# Patient Record
Sex: Female | Born: 1995 | Hispanic: No | Marital: Single | Smoking: Never smoker
Health system: Southern US, Community
[De-identification: ages and names within clinical notes are randomized; demographics above are authoritative.]

## PROBLEM LIST (undated history)

## (undated) DIAGNOSIS — N83209 Unspecified ovarian cyst, unspecified side: Secondary | ICD-10-CM

## (undated) DIAGNOSIS — Z789 Other specified health status: Secondary | ICD-10-CM

## (undated) DIAGNOSIS — H539 Unspecified visual disturbance: Secondary | ICD-10-CM

## (undated) DIAGNOSIS — R51 Headache: Secondary | ICD-10-CM

## (undated) HISTORY — DX: Other specified health status: Z78.9

## (undated) HISTORY — DX: Unspecified visual disturbance: H53.9

## (undated) HISTORY — DX: Unspecified ovarian cyst, unspecified side: N83.209

## (undated) HISTORY — PX: WISDOM TOOTH EXTRACTION: SHX21

## (undated) HISTORY — DX: Headache: R51

---
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
## 2014-01-18 ENCOUNTER — Telehealth: Payer: Self-pay | Admitting: Pediatric Neurology
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
## 2014-01-27 ENCOUNTER — Encounter: Payer: Self-pay | Admitting: Pediatric Neurology

## 2014-01-27 ENCOUNTER — Ambulatory Visit: Payer: Self-pay | Admitting: Pediatric Neurology

## 2014-01-27 VITALS — BP 105/74 | HR 91 | Ht 62.05 in | Wt 124.3 lb

## 2014-01-27 DIAGNOSIS — F32A Depression, unspecified: Secondary | ICD-10-CM

## 2014-01-27 DIAGNOSIS — G43009 Migraine without aura, not intractable, without status migrainosus: Secondary | ICD-10-CM

## 2014-01-27 DIAGNOSIS — F419 Anxiety disorder, unspecified: Secondary | ICD-10-CM

## 2014-01-27 MED ORDER — TOPIRAMATE 25 MG PO TABS *I*
ORAL_TABLET | ORAL | Status: DC
Start: 2014-01-27 — End: 2014-04-14

## 2014-01-27 MED ORDER — AMITRIPTYLINE HCL 25 MG PO TABS *I*
25.0000 mg | ORAL_TABLET | Freq: Every evening | ORAL | Status: DC
Start: 2014-01-27 — End: 2014-05-25

## 2014-01-27 NOTE — Patient Instructions (Signed)
1.  Wean from Topamax.  Take 3 tablets (75 mg) nightly x 1 week then,  Take 2 tablets (50 mg) nightly x 1 week, then  Take 1 tablet (25 mg) nightly x 1 week  Then discontinue.    2.  Increase Amitriptyline to 25 mg nightly.  Call in 3 weeks with update and possible dosage adjustment.    3.  Follow up in 6 weeks.

## 2014-01-27 NOTE — Progress Notes (Signed)
Dear Dr. Carrington Clamp,     Interim History:  Martha Soto was last seen in Child Neurology on 06/04/2013.  She was unaccompanied for today's visit. . At the last clinic visit the plan was to :    Headache Prevention:   Continue current dose of Topamax 100 mg nightly   Add Amitriptyline 10 mg nightly   Abortive Management:   -Give medication for pain at first sign on headache onset: Imitrex   -Daily preventative medication should be considered or adjusted if more than 2 doses of pain medication are needed weekly.   Lifestyle Recommendations and/or Modifications:   -Avoid skipping meals, especially breakfast   -Increase non-caffeinated fluid intake   -Increase regular exercise   -Avoid daytime napping to keep regular sleep schedule   Follow up:   -Follow up appt in :6 months     Since that time, Martha Soto reports the following:    -Frequency of attacks: Daily; continuous, but does have some breaks from the pain.  Intensity level is 8-10/10.    -Location & type of pain:  Frontal and behind eyes and radiates to the back of her head; describes pain as pressure.  -Associated symptoms:  Dizziness, nausea, photo and phonophobia, hands and feet feel weak.   -Duration of attacks:  continuous  -Triggers identified:  Anger (home situation, particularly with mother).  Appetite: Eats a small breakfast like cereal or bagel and then nothing more the rest of the day or gets a small snack later in the evening.  Hydration:  Drinks Brisk during the day -2-3 soda bottles/day.  She also drinks water or orange juice and milk.   Sleep pattern:  Awakens in the middle of the night-3-4 am.  She may fall back to sleep immediately or be up for an hour at a time.  Sleep onset-1-2 hours.   -Recent stressors:   home  -currently taking Topamax 100 mg nightly and Amitriptyline 10 mg nightly.    Medical History: As noted in interim history.                                                                                                                            Family history: No interim changes.     Personal History:    Holiday representative at [employer];  Accepted at [employer], but would like to attend a community college outside of [place]2 + 2 program[place]  Plans on [employer] or Sociology with ultimate plan for law school.  Works as a Designer, fashion/clothing. She is seeing Nature conservation officer for therapy for depression.    Allergy:  Review of patient's allergies indicates no known allergies (drug, envir, food or latex).    Current Medications: Medications reviewed and list updated.  Current Outpatient Prescriptions   Medication Sig Dispense Refill    naproxen (NAPROSYN) 500 MG tablet         amitriptyline (ELAVIL) 25 MG tablet Take 1 tablet (25 mg total) by mouth  nightly  30 tablet  5    topiramate (TOPAMAX) 25 MG tablet Wean from Topamax according to schedule provided to patient.  45 tablet  0    Omeprazole (PRILOSEC) 20 MG TBEC         HYDROcodone-acetaminophen (NORCO) 5-325 MG per tablet Take 1 tablet by mouth every 4-6 hours as needed for Pain   MDD 5 tablets  5 tablet  0    benzoyl peroxide-erythromycin (BENZAMYCIN) gel         fluticasone (FLONASE) 50 MCG/ACT nasal spray         ibuprofen (ADVIL,MOTRIN) 600 MG tablet         loratadine (CLARITIN) 10 MG tablet         TAZORAC 0.1 % cream         SUMAtriptan (IMITREX) 50 MG tablet Take 1 tablet (50 mg total) by mouth as needed for Migraine   . May repeat once in 2 hours. MDD: 2 tabs Max weekly dose 4 tabs  9 tablet  3    norethindrone-ethinyl estradiol (BALZIVA) 0.4-35 MG-MCG per tablet Take 1 tablet by mouth daily         No current facility-administered medications for this visit.        ROS :    A 10 point review of systems was completed and was negative except for what was mentioned in the Interim History.  Exam:  BP 105/74   Pulse 91   Ht 1.576 m (5' 2.05")   Wt 56.4 kg (124 lb 5.4 oz)   BMI 22.71 kg/m2  On neurological examination, Shewas alert, attentive, fluent, and cogent. There was normal  pupillary function and gaze was conjugate, without nystagmus.  facial movements were symmetric with normal strength. Hearing appeared grossly intact. Her  tongue was midline. She had normal muscle tone and with no pronator drift and grossly normal strength.  She displayed no dysmetria on finger-to-nose. Her deep tendon reflexes were normal and symmetrical. Her coordination was normal.  Her gait was normal and narrow based.  She was able to toe and heel walk, hop on one and two feet.  Romberg revealed no sway.         Assessment:   Martha Soto has a history of migraine without aura and depression.    Recommendations:   1.  Wean from Topamax.  Take 3 tablets (75 mg) nightly x 1 week then,  Take 2 tablets (50 mg) nightly x 1 week, then  Take 1 tablet (25 mg) nightly x 1 week  Then discontinue.    2.  Increase Amitriptyline to 25 mg nightly.  Call in 3 weeks with update and possible dosage adjustment.    3.  Follow up in 6 weeks.    Martha Soto's family was instructed to contact either the primary care physician office or our office by telephone if there is any deterioration in her neurologic status, change in presenting symptoms, lack of beneficial response to treatment plan, or signs of adverse effects of current therapies, all of which were reviewed.       Electronically signed by Arvil PersonsYNTHIA Calaya Gildner, NP 3:57 PM 01/27/2014.  Division of Child Neurology  [facility]  [address], [address]  [place], [place]  [phone]  [phone]  [phone]-fax

## 2014-04-14 ENCOUNTER — Encounter: Payer: Self-pay | Admitting: Pediatric Neurology

## 2014-04-14 ENCOUNTER — Ambulatory Visit: Payer: Self-pay | Admitting: Pediatric Neurology

## 2014-04-14 VITALS — BP 110/62 | HR 88 | Ht 62.17 in | Wt 128.3 lb

## 2014-04-14 DIAGNOSIS — R519 Headache, unspecified: Secondary | ICD-10-CM

## 2014-04-14 DIAGNOSIS — G43009 Migraine without aura, not intractable, without status migrainosus: Secondary | ICD-10-CM

## 2014-04-14 MED ORDER — TOPIRAMATE 25 MG PO TABS *I*
ORAL_TABLET | ORAL | Status: AC
Start: 2014-04-14 — End: ?

## 2014-04-14 NOTE — Progress Notes (Signed)
Dear Dr. Carrington ClampMax Steiner,     Interim History:  Martha Soto was last seen in Child Neurology on 01/28/2014.  Today she was unccompanied for her visit. At the last clinic visit the plan was to :    Headache Prevention:  Continue current dose of  Topamax 100 mg nightly  Add Amitriptyline 10 mg nightly  Abortive Management:  -Give medication for pain at first sign on headache onset: Imitrex  -Daily preventative medication should be considered or adjusted if more than 2 doses of pain medication are needed weekly.   Lifestyle Recommendations and/or Modifications:   -Avoid skipping meals, especially breakfast  -Increase non-caffeinated fluid intake  -Increase regular exercise  -Avoid daytime napping to keep regular sleep schedule  Follow up:  -Follow up appt in :6 months    Since that time, Martha Soto reports the following: Her depression is under better control with the Amitriptyline, however, she thinks her headaches were much less intense with Topamax.    -Frequency of attacks: Daily, not continuous but lasts several hours.  -Location & type of pain:  Top of head or right parietal.  She feels as though the pain is also radiating throughout her whole head. Today, she rates her pain score at a 9, however, she was very interactive throughout the visit and happy.  -Associated symptoms:   Dizziness, vision changes (very brief), nausea   -Duration of attacks:  One hour to several hours or all day until she goes to sleep.  -Triggers identified:  Unable to determine.  Appetite: Poor appetite due to pain, but she is eating regular meals.  Hydration:  is drinking plenty of fluids.   Sleep pattern:  Goes to bed at 10-11pm.  It takes 1-1/2 hours to fall asleep.  She awakens during the night and remains awake for 30 minutes then returns to sleep.  This occurs 2-3 times during the night. She awakens by 6:30 am.     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 Janyth ContesGoldblatt once/weekly for therapy.  She finds this to be helpful but would like to discuss coping mechanisms and strategies with her.  She feels that her depression is much better managed, she is in a better mood.    Medical History: As noted in interim history.                                                                                                                           Family history: No interim changes.     Personal History:    Senior year of high school.    Allergy:  Review of patient's allergies indicates no known allergies (drug, envir, food or latex).    Current Medications: Medications reviewed and list updated.  Current Outpatient Prescriptions on File Prior to Visit   Medication Sig Dispense Refill    naproxen (NAPROSYN) 500 MG tablet  amitriptyline (ELAVIL) 25 MG tablet Take 1 tablet (25 mg total) by mouth nightly  30 tablet  5    topiramate (TOPAMAX) 25 MG tablet Wean from Topamax according to schedule provided to patient.  45 tablet  0    Omeprazole (PRILOSEC) 20 MG TBEC         HYDROcodone-acetaminophen (NORCO) 5-325 MG per tablet Take 1 tablet by mouth every 4-6 hours as needed for Pain   MDD 5 tablets  5 tablet  0    benzoyl peroxide-erythromycin (BENZAMYCIN) gel         fluticasone (FLONASE) 50 MCG/ACT nasal spray         ibuprofen (ADVIL,MOTRIN) 600 MG tablet         loratadine (CLARITIN) 10 MG tablet         TAZORAC 0.1 % cream         SUMAtriptan (IMITREX) 50 MG tablet Take 1 tablet (50 mg total) by mouth as needed for Migraine   . May repeat once in 2 hours. MDD: 2 tabs Max weekly dose 4 tabs  9 tablet  3    norethindrone-ethinyl estradiol (BALZIVA) 0.4-35 MG-MCG per tablet Take 1 tablet by mouth daily         No current facility-administered medications on file prior to visit.        ROS :    A 10 point review of systems was completed and was negative except for what was mentioned in the Interim History.  Exam:  BP 110/62    Pulse 88    Ht 1.579 m (5'  2.17")    Wt 58.2 kg (128 lb 4.9 oz)    BMI 23.34 kg/m2     On neurological examination, Shewas alert, attentive, fluent, and cogent. There was normal pupillary function and gaze was conjugate, without nystagmus.  facial movements were symmetric with normal strength. Hearing appeared grossly intact. Her  tongue was midline. She had normal muscle tone and with no pronator drift and grossly normal strength.  She displayed no dysmetria on finger-to-nose. Her deep tendon reflexes were normal and symmetrical. Her coordination was normal.  Her gait was normal and narrow based.  She was able to toe and heel walk, hop on one and two feet.  Romberg revealed no sway.         Assessment:   Martha Soto has a history of Chronic Daily Headache and Migraine without Aura. She has a poor sleep quality which may be contributing to her symptoms.    Recommendations:   1.  Continue Amitriptyline 25 mg nightly.  2.  Restart Topamax at 25 mg nightly for 1 week, then increase to 50 mg x 1 week, then 75 mg x 1 week, then 100 mg nightly.  3.  If considering a change in birth control, I would recommend a low estrogen pill like Lo-Loestrin with Fe.  4.  Continue good fluid intake each day.  5.  Follow up with Adult Neurology-Marina Lula Olszewskionnolly, MD        Martha Soto's family was instructed to contact either the primary care physician office or our office by telephone if there is any deterioration in her neurologic status, change in presenting symptoms, lack of beneficial response to treatment plan, or signs of adverse effects of current therapies, all of which were reviewed.       Electronically signed by Arvil PersonsYNTHIA Halei Hanover, NP 4:17 PM 04/14/2014.  Division of Child Neurology  [facility]  [address], [address]  [address] [place]  [address]  [phone]  [phone]-fax

## 2014-04-14 NOTE — Patient Instructions (Addendum)
Recommendations:   1.  Continue Amitriptyline 25 mg nightly.  2.  Restart Topamax at 25 mg nightly for 1 week, then increase to 50 mg x 1 week, then 75 mg x 1 week, then 100 mg nightly.  3.  If considering a change in birth control, I would recommend a low estrogen pill like Lo-Loestrin with Fe.  4.  Continue good fluid intake each day.  5.  Follow up with Adult Neurology

## 2014-05-25 ENCOUNTER — Ambulatory Visit: Payer: Self-pay | Admitting: Neurology

## 2014-05-25 ENCOUNTER — Encounter: Payer: Self-pay | Admitting: Neurology

## 2014-05-25 ENCOUNTER — Telehealth: Payer: Self-pay | Admitting: Neurology

## 2014-05-25 VITALS — BP 117/67 | HR 85 | Ht 62.0 in | Wt 121.7 lb

## 2014-05-25 DIAGNOSIS — IMO0002 Reserved for concepts with insufficient information to code with codable children: Secondary | ICD-10-CM

## 2014-05-25 MED ORDER — AMITRIPTYLINE HCL 25 MG PO TABS *I*
50.0000 mg | ORAL_TABLET | Freq: Every evening | ORAL | Status: AC
Start: 2014-05-25 — End: 2014-06-24

## 2014-05-25 MED ORDER — AMITRIPTYLINE HCL 25 MG PO TABS *I*
50.0000 mg | ORAL_TABLET | Freq: Every evening | ORAL | Status: DC
Start: 2014-05-25 — End: 2014-05-25

## 2014-05-25 NOTE — Progress Notes (Signed)
 NEUROLOGY Clinic Consult Note    Referring Provider: Dr. Madaline Guthrie    Reason for consult: headaches    History of Present Illness:      Thank you for referring Martha Soto for evaluation at my [facility] on 05/25/2014    She is a 18 y.o. R -handed female referred for chronic headaches for the past 3-4 years.  Martha Soto c/o recurrent throbbing/sharp headaches that start on the left side of her head, "deep behind left eye" but rapidly radiating to involve her entire head, back of neck, and both shoulders.  Headaches are usually associated with nausea, photophobia, phonophobia, and bilateral blurry vision, and also with "black spots" that flash in both eyes (when closed) for about 10-20 minutes early in headache.  Her headaches are usually preceded by a cold sensation of left side of head.  During headache her scalp is tender in temporal regions, as well as left occipital notch area.  There are no autonomic sx.  She sometimes feels lightheaded but there is no vertigo.  She has no focal weakness or sensory sx or speech disturbance; she does feel off balanced during headache.  No diplopia, face numbness.  She does feel weak "all over" during headache.  Headaches frequently last entire day up to 3 days), and she does have trouble concnetrating and maintaining normal activities when she has headache.  She does get some headache relief if she lays down, rests in dark room.  Over-the-counter meds tylenol, ibuprofen, naproxen) provide minimal relief.  She has some benefit from imitrex, lasting a few hours.  She has missed school multiple times due to headaches.  She has no specific neurologic sx between headaches.      Over the past year or so the headaches have become more frequent, now occurring near-daily.  When first symptomatic h/a frequency was 1-2 times weekly.  Over past year headaches have been present at least 5 days per week.  They tend to begin in the late morning, when at school, then worsen as day  progresses.  There is no exacerbation with coughing, sneezing, although poor sleep does worsen headache.  At times she has been awakened by headaches, although for the most part sleep seems to provide headache relief.  When severe there is associated nausea, vomiting, photophobia, and phonophobia, although she has low level photophobia chronically.      Regarding sleep: she gets on average 5-6 hours per night, frequently laying in bed prior to sleep either watching television or texting her friends.  There is no h/o snoring, sleep-disordered breathing, or RLS.  She rarely awakes refreshed.  She does feel sleepy during the day often., especialy in school (now a senior at [employer], heading to [employer] in the fall).  She works 20 hours per week.  She drinks one caffeinated beverage per day.  She is on a low-estrogen oral contraceptive.  She has h/o depression and self-mutilation behavior (cutting herself).  She was treated with an anti-depressant in past, now in therapy weekly.    She is currently on topamax 75 mg qhs and amitriptyline 25 mg qhs for h/a prophylaxis.  She had been on topamax previously (1-2 years ag), with some benefit on h/a seveity.  However, she did c/o sedation and suppressed appetite on topamax, perhaps worsening depression.  Topamax was stopped after several months, recently restarted due to headache worsening.  She says headache frequency have not significantly changed since topamax was restarted, although they may be less severe in  intensity.  She is not using OTC analgesics or any analgesic or triptan at this time.            PMH  Past Medical History   Diagnosis Date   . Allergy history unknown      seasonal   . Headache(784.0)    . Vision abnormalities      wears contacts   . Ovarian cyst        PSH  No past surgical history on file.      Medications: reviewed        ALLERGIES:    No Known Allergies (drug, envir, food or latex) seasonal        FH  Family History   Problem Relation Age of Onset    . Hypertension Mother    . Migraines Mother    . Diabetes Maternal Grandfather    . Learning disabilities Maternal Uncle          SOCIAL HISTORY  History     Social History   . Marital Status: Single     Spouse Name: N/A     Number of Children: N/A   . Years of Education: N/A     Social History Main Topics   . Smoking status: Never Smoker    . Smokeless tobacco: Never Used   . Alcohol Use: No   . Drug Use: No   . Sexual Activity: None     Other Topics Concern   . None     Social History Narrative    Marijuana-last 5 months ago    Alcohol-last 1 year ago    Denies cigarettes.      Lives at home with mom, moms boyfriends, 3 brothers, 1 uncle, 2 birds and a dog. Sees dad occasionally. In 12th grade at [employer].  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  On BCPs.  HEME/LYMPH: No easy bleeding/bruising or swollen nodes          Physical Exam  BP 117/67   Pulse 85   Ht 1.575 m (5\' 2" )   Wt 55.203 kg (121 lb 11.2 oz)   BMI 22.25 kg/m2       Examination:  General: WD/WN female, NAD.  HEENT:  Normocephalic/atraumatic.  Neck:  Supple; no bruits.  Cardiac: RRR.  S1/S2.  Pulmonary: clear  Pulses: normal radials.    Neurologic Examination:      Mental status: awake, alert and fully oriented.  Speech and language are normal without dysarthria or aphasia.  Follows simple, left-right, multistepped commands.  No apraxia or neglect.  Attention, concentration and memory are grossly  intact.  Higher cognitive functions are generally normal.      Cranial nerves: Pupils equally round and reactive to light.   There is no afferent pupillary defect.  Visual fields are full.  Visual acuity  J1(-1)/J1.  Fundi  sharp without papilledema or hemorrhage.  Extraocular movements full without nystagmus.  Facial sensation  intact bilaterally to PP/LT  in all three trigeminal divisions.  There is no facial weakness.  Hearing is intact to finger rub bilaterally.  Palate/uvula with midline elevation.  Gag intact.  Shoulder shrug  5/5. Tongue midline without atrophy, weakness, or fasciculations.      Motor:  power 5/5 throughout.    No pronator drift.  Muscle bulk and tone normal.  No fasciculations.  No dyskinesias    Sensory:  intact to pain, temperature, light touch, vibration, and proprioception.  Romberg negative. There is no extinction to double simultaneous stimulation.  Graphesthesia normal.      Gait steady with normal associated arm movements.  Toe/heel/hop normal.  Tandem performed well.      Coordination testing showed normal finger-nose-finger, heel-to-shin and rapid alternating movements bilaterally.      Reflexes: Biceps 2+/2+, BR 2+/2+, Tri 2+/2+, KJ 2+/2+, AJ 2+/2+.  Toes both downgoing.  No ankle clonus.  No frontal release signs.          Labs:            Imaging:            IMPRESSION    Chronic migraine, with near-daily headaches, likely in part driven by sleep deprivation.  She has reassuringly normal neurologic exam.  She has an usual h/a prophylaxis regimen of topamax and amitriptyline both at suboptimal doses.   She drinks moderate caffeine but does not use analgesics or triptans.  Does not appear overtly depressed but she does have significant stressors from work and school.      RECOMMENDATIONS:    1.  I would prefer monotherapy with amitriptyline (for h/a prophylaxis, perhaps sleep, and depression).  Will increase to 5 mg qhs.  2.  Will taper off of topamax: 50 mg qhs x 1 week, 25 mg qhs  x 1 week, then stop  3.  Headache diary.  No analgesics or triptans for now.  4.   She needs better sleep hygiene: to discuss w/Dr. Madaline Guthrie.      rtc 3 months but consider headache clinic referral if no i

## 2014-05-25 NOTE — Telephone Encounter (Signed)
I can rewrite for 60 tablets.  Can you call pharmacy?  Thanks.

## 2014-05-25 NOTE — Patient Instructions (Signed)
1.  Wean off topamax (25 mg).  Take 2 topamax at bedtime for 1 week, then 1 topamax at bedtime for 1 week, then stop.  2.  Increase amitriptyline to 50 mg (two 25 mg tabs) at bedtime  3.  You need to address sleep hygiene with your pediatrician  4.  I will refer you to headache specialist  5.  No over-the-counter medications tylenol, advil, etc)

## 2014-05-25 NOTE — Telephone Encounter (Signed)
Pharmacy needs clarification on amitrtiptyline script #30 prescribed, directions state 2 at bedtime

## 2014-08-17 ENCOUNTER — Ambulatory Visit: Payer: Self-pay | Admitting: Neurology

## 2015-02-07 ENCOUNTER — Other Ambulatory Visit: Payer: Self-pay | Admitting: Emergency Medicine

## 2015-02-07 LAB — COMPREHENSIVE METABOLIC PANEL
ALT: 19 U/L^U/L (ref 12–78)
AST: 13 U/L^U/L (ref 11–37)
Albumin: 3.6 g/dL^g/dL (ref 3.4–5.0)
Alk Phos: 52 U/L^U/L (ref 45–117)
Anion Gap: 8 (ref 3–11)
Bilirubin,Total: 0.3 mg/dL^mg/dL (ref 0.2–1.0)
CO2: 26 meq/L^meq/L (ref 21–32)
Calcium: 8.7 mg/dL^mg/dL (ref 8.5–10.1)
Chloride: 103 meq/L^meq/L (ref 98–107)
Creatinine: 0.7 mg/dL^mg/dL (ref 0.6–1.3)
GFR,Black: 130 mL/min/{1.73_m2}
GFR,Caucasian: 108 mL/min/{1.73_m2}
Glucose: 75 mg/dL^mg/dL (ref 70–100)
Lab: 12 mg/dL^mg/dL (ref 7–18)
Potassium: 3.7 meq/L^meq/L (ref 3.5–5.1)
Sodium: 137 meq/L^meq/L (ref 136–145)
Total Protein: 7.5 g/dL^g/dL (ref 6.4–8.2)

## 2015-02-07 LAB — URINALYSIS WITH REFLEX TO MICROSCOPIC
Bilirubin,Ur: NEGATIVE
Blood,UA: NEGATIVE
Glucose,UA: NEGATIVE mg/dL^mg/dL
Ketones, UA: NEGATIVE mg/dL^mg/dL
Leuk Esterase,UA: NEGATIVE
Nitrite,UA: NEGATIVE
RBC,UA: 0 /HPF^/HPF (ref 0–5)
Renal Epithel,UA: 9 /HPF^/HPF
Specific Gravity,UA: 1.026 (ref 1.005–1.030)
Urobilinogen,UA: 0.2 mg/dL^mg/dL
WBC,UA: 5 /HPF^/HPF (ref 0–5)
pH,UA: 7 (ref 5.0–8.0)

## 2015-02-07 LAB — CBC AND DIFFERENTIAL
Baso # K/uL: 0 10*3/uL (ref 0.0–0.1)
Basophil %: 0.4 %^% (ref 0.1–1.2)
Eos # K/uL: 0.1 10*3/uL (ref 0.0–0.4)
Eosinophil %: 1.2 %^% (ref 0.7–5.8)
Hematocrit: 42.2 %^% (ref 34.1–44.9)
Hemoglobin: 14.5 g/dL^g/dL (ref 11.2–15.7)
Immature Granulocytes Absolute: 0.03 10*3/uL (ref 0.0–0.2)
Immature Granulocytes: 0.3 %^% (ref 0.0–2.0)
Lymph # K/uL: 5.1 10*3/uL — ABNORMAL HIGH (ref 1.2–3.7)
Lymphocyte %: 53.9 %^% — ABNORMAL HIGH (ref 19.3–51.7)
MCH: 28.7 pg^pg (ref 25.6–32.2)
MCHC: 34.4 g/dL^g/dL (ref 32.2–35.5)
MCV: 83.6 fL^fL (ref 79.4–94.8)
Mono # K/uL: 0.7 10*3/uL (ref 0.2–0.9)
Monocyte %: 7.3 %^% (ref 4.7–12.5)
Neut # K/uL: 3.5 10*3/uL (ref 1.6–6.0)
Nucl RBC %: 0 /100 WBC^/100 WBC (ref 0.0–0.2)
Platelets: 272 10*3/uL (ref 182–369)
RBC Distribution Width-SD: 35.4 fL^fL — ABNORMAL LOW (ref 36.4–46.3)
RBC: 5.05 10*6/uL (ref 3.93–5.22)
RDW: 11.9 %^% (ref 11.7–14.4)
Seg Neut %: 36.9 %^% (ref 34.0–71.1)
WBC: 9.5 10*3/uL (ref 4.0–10.0)

## 2015-02-07 LAB — PREGNANCY TEST, SERUM: Preg,Serum: NEGATIVE

## 2015-02-07 LAB — LIPASE: Lipase: 150 U/L^U/L (ref 73–393)

## 2015-08-21 ENCOUNTER — Emergency Department
Admission: EM | Admit: 2015-08-21 | Disposition: A | Discharge status: Home / Self Care | Payer: Self-pay | Source: Ambulatory Visit | Attending: Emergency Medicine | Admitting: Emergency Medicine

## 2015-08-21 NOTE — First Provider Contact (Signed)
ED Medical Screening Exam Note    Initial provider evaluation performed by   ED First Provider Contact     Date/Time Event User Comments    08/21/15 2321 ED Provider First Contact Nastashia Gallo AYERS Initial Face to Face Provider Contact      Low abd pain since this am.  She missed BC and restarted.     Vital signs reviewed.    Orders placed:  LABS     Supervising physician Dr. Ruben Reason was immediately available.      Patient requires further evaluation.     Joellyn Haff, NP, 08/21/2015, 11:21 PM         Joellyn Haff, NP  08/21/15 1610

## 2015-08-21 NOTE — ED Triage Notes (Signed)
Pt to ed with c/o:  1. Abdominal pain  2. Numbness  3. Chest Pain  4. Nausea  5. Emesis  6. Headache  7. Dizziness    Sx began yesterday, pain increased this am        Triage Note   Lazarus Gowda, RN

## 2015-08-22 ENCOUNTER — Encounter: Payer: Self-pay | Admitting: Emergency Medicine

## 2015-08-22 ENCOUNTER — Other Ambulatory Visit: Payer: Self-pay | Admitting: Gastroenterology

## 2015-08-22 ENCOUNTER — Other Ambulatory Visit: Payer: Self-pay | Admitting: Emergency Medicine

## 2015-08-22 LAB — CBC AND DIFFERENTIAL
Baso # K/uL: 0 10*3/uL (ref 0.0–0.1)
Basophil %: 0.3 %
Eos # K/uL: 0.1 10*3/uL (ref 0.0–0.4)
Eosinophil %: 1.4 %
Hematocrit: 43 % (ref 34–45)
Hemoglobin: 14 g/dL (ref 11.2–15.7)
IMM Granulocytes #: 0 10*3/uL (ref 0.0–0.1)
IMM Granulocytes: 0.1 %
Lymph # K/uL: 4.3 10*3/uL — ABNORMAL HIGH (ref 1.2–3.7)
Lymphocyte %: 46.6 %
MCH: 28 pg/cell (ref 26–32)
MCHC: 33 g/dL (ref 32–36)
MCV: 87 fL (ref 79–95)
Mono # K/uL: 0.6 10*3/uL (ref 0.2–0.9)
Monocyte %: 6.3 %
Neut # K/uL: 4.2 10*3/uL (ref 1.6–6.1)
Nucl RBC # K/uL: 0 10*3/uL (ref 0.0–0.0)
Nucl RBC %: 0 /100 WBC (ref 0.0–0.2)
Platelets: 260 10*3/uL (ref 160–370)
RBC: 4.9 MIL/uL (ref 3.9–5.2)
RDW: 12.7 % (ref 11.7–14.4)
Seg Neut %: 45.3 %
WBC: 9.2 10*3/uL (ref 4.0–10.0)

## 2015-08-22 LAB — RUQ PANEL (ED ONLY)
ALT: 15 U/L (ref 0–35)
AST: 20 U/L (ref 0–35)
Albumin: 4.1 g/dL (ref 3.5–5.2)
Alk Phos: 36 U/L — ABNORMAL LOW (ref 50–130)
Amylase: 75 U/L (ref 28–100)
Bilirubin,Direct: <0.2 mg/dL (ref 0.0–0.3)
Bilirubin,Total: 0.4 mg/dL (ref 0.0–1.2)
Lipase: 28 U/L (ref 13–60)
Total Protein: 6.6 g/dL (ref 6.3–7.7)

## 2015-08-22 LAB — HOLD GREEN WITH GEL

## 2015-08-22 LAB — BHCG, QUANT PREGNANCY: BHCG, QUANT PREGNANCY: <1 m[IU]/mL (ref 0–1)

## 2015-08-22 MED ORDER — ONDANSETRON 4 MG PO TBDP *I*
4.0000 mg | ORAL_TABLET | Freq: Three times a day (TID) | ORAL | 0 refills | Status: DC | PRN
Start: 2015-08-22 — End: 2018-01-08

## 2015-08-22 MED ORDER — ONDANSETRON HCL 2 MG/ML IV SOLN *I*
4.0000 mg | Freq: Once | INTRAMUSCULAR | Status: AC
Start: 2015-08-22 — End: 2015-08-22
  Administered 2015-08-22: 4 mg via INTRAVENOUS
  Filled 2015-08-22: qty 2

## 2015-08-22 MED ORDER — IOHEXOL 350 MG/ML (OMNIPAQUE) IV SOLN *I*
1.0000 mL | Freq: Once | INTRAVENOUS | Status: AC
Start: 2015-08-22 — End: 2015-08-22
  Administered 2015-08-22: 85 mL via INTRAVENOUS

## 2015-08-22 MED ORDER — IBUPROFEN 400 MG PO TABS *I*
600.0000 mg | ORAL_TABLET | Freq: Once | ORAL | Status: AC
Start: 2015-08-22 — End: 2015-08-22
  Administered 2015-08-22: 600 mg via ORAL
  Filled 2015-08-22 (×2): qty 1

## 2015-08-22 MED ORDER — ONDANSETRON 4 MG PO TBDP *I*
4.0000 mg | ORAL_TABLET | Freq: Once | ORAL | Status: AC
Start: 2015-08-22 — End: 2015-08-22
  Administered 2015-08-22: 4 mg via ORAL
  Filled 2015-08-22: qty 1

## 2015-08-22 MED ORDER — OXYCODONE-ACETAMINOPHEN 5-325 MG PO TABS *I*
2.0000 | ORAL_TABLET | Freq: Once | ORAL | Status: AC
Start: 2015-08-22 — End: 2015-08-22
  Administered 2015-08-22: 2 via ORAL
  Filled 2015-08-22: qty 2

## 2015-08-22 MED ORDER — SODIUM CHLORIDE 0.9 % IV BOLUS *I*
1000.0000 mL | Freq: Once | Status: AC
Start: 2015-08-22 — End: 2015-08-22
  Administered 2015-08-22: 1000 mL via INTRAVENOUS

## 2015-08-22 NOTE — ED Notes (Signed)
Medical student at bedside

## 2015-08-22 NOTE — ED Notes (Signed)
Pt tolerating PO intake appropriately.  

## 2015-08-22 NOTE — ED Notes (Signed)
Pt up and ambulatory without assistance. Pt tolerating PO intake. Pt D/C instructions reviewed. Pt comfortable with D/C planning and verbalizes understanding of D/C instruction. Pt will follow up with PCP. Pt belongings with pt. Pt safe to D/C a this time.

## 2015-08-22 NOTE — Progress Notes (Signed)
Patient DOB:  [birth year]    Patient Address:  [address]  [place] [address]    Patient Phone:  [phone] (home)     Patient Insurance:  [place],    [place],    [id number]  [place],    MCD,    [id number]          Patient PCP:  Sonda Primes, MD    Patient Appointment Calendar  @    Intervention Status:  DSRIP Intervention status: Initial intervention  DSRIP Patient?: Yes  Patient address confirmation: Yes  Patient phone confirmation: Yes  Patient insurance confirmation: Yes  Patient FCM referred?: No  Patient PCP confirmation: Yes  PCP Appt type: Follow up appt  Follow up appointment center/address::  [facility] [address] [place] [address])  Patient followup provider: Carrington Clamp   [facility] Patient followup appointment date: 08/28/15  Patient followup appointment time: 1615  Patient transport plan: self  CHW handoff to involved agency?: No  FCM Patient application complete?: No  PAM completed?: Refused  Time Spent with Patient (min): 25    [employer]  CHW  [phone]

## 2015-08-22 NOTE — ED Provider Notes (Addendum)
History     Chief Complaint   Patient presents with    Abdominal Pain    Dizziness    Headache    Nausea    Emesis    Chest Pain    Numbness       HPI Comments: 18yo with hx of ovarian cysts p/w n/v, abd pain starting yesterday.     She also notes chest pain which began at the same time, however she state get this pain from time to time, has been told it is due to her fibroid (i believe she is referring to fibrocystic change) and was prescribed tylenol. Was at [facility] today and told she had costocondritis.     Developed right leg numbness and tingling as well as headache and dizziness.      Takes ocp but misses doses frequently. LMP in June.       History provided by:  Patient      Past Medical History   Diagnosis Date    Allergy history unknown      seasonal    Headache(784.0)     Ovarian cyst     Vision abnormalities      wears contacts            No past surgical history on file.    Family History   Problem Relation Age of Onset    Hypertension Mother     Migraines Mother     Diabetes Maternal Grandfather     Learning disabilities Maternal Uncle          Social History      reports that she has never smoked. She has never used smokeless tobacco. She reports that she does not drink alcohol or use illicit drugs. Her sexual activity history is not on file.    Living Situation     Questions Responses    Patient lives with [facility]     Caregiver for other family member     External Services     Employment     Domestic Violence Risk           Problem List     Patient Active Problem List   Diagnosis Code    Ovarian cyst rupture N83.29    Abdominal pain R10.9    Headache(784.0) R51       Review of Systems   Review of Systems   Constitutional: Negative for chills and fever.   HENT: Negative for congestion and sore throat.    Eyes: Negative for visual disturbance.   Respiratory: Negative for cough and shortness of breath.    Cardiovascular: Positive for chest pain. Negative for leg swelling.    Gastrointestinal: Positive for abdominal pain, nausea and vomiting. Negative for constipation and diarrhea.   Genitourinary: Negative for dysuria, flank pain, pelvic pain and vaginal discharge (states it is clear, no odor, same as always).   Allergic/Immunologic: Negative for immunocompromised state.   Neurological: Positive for dizziness, numbness and headaches.       Physical Exam     ED Triage Vitals   BP Heart Rate Heart Rate (via Pulse Ox) Resp Temp Temp src SpO2 O2 Device O2 Flow Rate   08/21/15 2324 08/21/15 2324 -- 08/21/15 2324 08/21/15 2324 08/21/15 2324 08/21/15 2324 08/21/15 2324 --   115/79 88  16 36.5 C (97.7 F) TEMPORAL 100 % None (Room air)       Weight  08/21/15 2324           57.2 kg (126 lb)               Physical Exam   Constitutional: She appears well-developed and well-nourished. No distress.   HENT:   Head: Normocephalic and atraumatic.   Mouth/Throat: Oropharynx is clear and moist.   Eyes: Conjunctivae are normal. Pupils are equal, round, and reactive to light. No scleral icterus.   Cardiovascular: Normal rate, regular rhythm and normal heart sounds.  Exam reveals no gallop and no friction rub.    No murmur heard.  Pulmonary/Chest: Effort normal. She exhibits tenderness.   Clear equal bilateral breath soudns   Abdominal: Soft. She exhibits no distension. There is tenderness (epigastrium and RLQ ). There is no guarding.   Musculoskeletal: She exhibits no edema.   No injury or deformity. Moving all extremities spontaneously.      Skin:   Warm and well perfused. No rashes, petechiae, purpura or pallor.      Psychiatric: She has a normal mood and affect. Her behavior is normal.   Nursing note and vitals reviewed.      Medical Decision Making        Initial Evaluation:  ED First Provider Contact     Date/Time Event User Comments    08/21/15 2321 ED Provider First Contact Yaakov Guthrie Initial Face to Face Provider Contact          Patient seen by me today 08/22/2015 at  0147    Assessment:  19 y.o., female with hx of hemorrhagic cyst comes to the ED with 2 days of pleuritic, stabbing/shooting chest pain, TTP, as well as achy abdominal pain, migrating in location, currently epigastric and RLQ. Patient well appearing, in no acute distress, afvss    Differential Diagnosis includes    ovarian cyst rupture/hemorrhage/intermittant torsion   gastritis   constipation/functional abd pain  Biliary colic  Uti/renal colic  pregnancy   appendicitis - hx concerning but patient is well appearing, afebrile. unlikely    Plan:   Diagnostic: cbc, serum preg, ruq panel. [place] of pelvis and RLQ for atypical appy  Therapeutic: IV zofran, percocet       Michail Jewels, MD             Michail Jewels, MD  Resident  08/22/15 [phone]      Resident Attestation:     Patient seen by me 08/22/2015 at 0430    History:   I reviewed this patient, reviewed the resident's note and agree.  Exam:   I examined this patient, reviewed the resident's note and agree.    Decision Making:   I discussed with the resident his/her documented decision making  and agree.        Author Noah Delaine, MD     Noah Delaine, MD  08/22/15 [phone]

## 2015-08-22 NOTE — ED Provider Progress Notes (Signed)
EKG does not show evidence of right heart strain, chest x-ray and lower extremity venous Dopplers unremarkable.  Patient tolerating by mouth.  Suspect that she may have some kind of viral illness which may explain her enteritis.  She's been instructed to follow-up with her primary care doctor in the next 3-5 days for repeat evaluation and to return if she develops any concerning symptoms.     Loni Beckwith, MD  Resident  08/22/15 [phone]

## 2015-08-22 NOTE — ED Notes (Signed)
Pt to ultrasound at this time.

## 2015-08-22 NOTE — ED Notes (Signed)
Pt back from ultrasound. Will continue to monitor and treat per MD orders.

## 2015-08-22 NOTE — Discharge Instructions (Signed)
Your evaluated in the emergency department for multiple complaints.  Workup revealed thickening of distal small bowel which may be consistent with enteritis or inflammatory bowel disease.  This may be secondary to a viral illness.  Please stay hydrated and return if you develop any concerning symptoms including fever, chills, significant abdominal pain or inability to eat.  You have been prescribed ondansetron which is a medication for nausea.  Take this medication as directed.  You must follow-up with your primary care doctor next 3-5 days.

## 2015-08-22 NOTE — ED Notes (Signed)
Pt reports persistent nausea despite MAR intervention. Pt reports that R leg "is numb/tingling." Pt encouraged to reposition, get out of bed, or sit on edge of bed; pt refused all. Pt is AOx4, VSS, NAD. Will continue to monitor and treat per MD orders.

## 2015-08-22 NOTE — ED Provider Progress Notes (Signed)
Assumed care patient at signout pending CT abdomen pelvis.  CT revealed some thickening of her distal small bowel which may be consistent with enteritis or inflammatory bowel disease.  Appendix was normal.  Upon reassessment she states that she still does not feel well.  She continues to have some chest discomfort as well as pain and tingling in her right lower extremity.  She also states that she had a near syncopal episode yesterday.  She has no risk factors for PE aside from oral contraceptive use, with normal vital signs.  Will obtain EKG, chest x-ray and bilateral lower extremity Dopplers.  If negative, given low pretest probability for PE, will discharge patient home with return precautions.     Loni Beckwith, MD  Resident  08/22/15 [phone]

## 2015-08-22 NOTE — ED Notes (Signed)
Pt taken to CT.

## 2015-08-23 LAB — EKG 12-LEAD
P: 45 degrees
QRS: -33 degrees
Rate: 68 {beats}/min
T: 18 degrees

## 2015-09-01 NOTE — Telephone (Signed)
Pre-Operative Instructions Record                    HH [id number] MR    PRIOR TO SURGERY    NOW, you must STOP all aspirin, ibuprofen (Advil, Naproxen, Aleve, Motrin, etc.) and all vitamins and herbal supplements. YOU MAY TAKE ACETAMINOPHEN (TYLENOL).    FOLLOW YOUR SURGEON'S INSTRUCTIONS IF DIFFERENT THAN ABOVE.      DAY BEFORE SURGERY-09-01-15    Call [phone] between 1PM and 4PM and select option #1 to receive your arrival/surgery time.        Do not eat anything (including candy or gum) after midnight the night before your surgery.  ____________________________________________________________________________    Morrie Sheldon OF SURGERY-09-05-15    Up to 4 hours before your surgery time, clear liquids are allowed (unless your doctor tells you differently).  Examples: black coffee or tea (no dairy or non-dairy creamer), soda, water, clear apple or cranberry juice.  No orange or tomato juice.     MEDICATIONS   PLEASE REFER TO THE MEDICATION LIST ON THE ATTACHED PAGE AND ONLY TAKE THE MEDICATIONS MARKED ON THAT LIST.   DO NOT TAKE ANY MEDICATIONS THAT WERE ALREADY STOPPED (ABOVE).   Bring and use inhalers as needed.   Pain and anxiety medications may be taken with a sip of water at any time.    DO NOT WEAR ANY RINGS, JEWELRY, MAKEUP, DARK NAIL POLISH, HAIR PINS, BODY LOTION OR SCENTS.  You may brush your teeth and use deodorant.  If wearing eyeglasses, please bring a case.  DO NOT WEAR CONTACT LENSES.     __ BRING CPAP IF USING       __ Skin prep and instructions given       __ Face wash       __ Bring braces  ________________________________________________________________________________    AT THE HOSPITAL  Park in the Main Ramp garage.  Report to [facility] on [address].  Leave your belongings in the car and your visitors can bring them to your room after surgery.    Any questions? Call [phone], select option  1, and ask to speak with a nurse or call your surgeon.    The patient has participated in the development of this discharge plan and the above material has been reviewed.  Questions have been answered and he/she understands the contents of this plan and has received a copy of it.  Dewayne Hatch, RN 09/01/2015 8:05 AM        ON THE DAY OF YOUR SURGERY, FROM THE LIST OF MEDICATIONS BELOW, TAKE ONLY THOSE MEDICATIONS CHECKED.

## 2015-09-01 NOTE — Anesthesia Preprocedure Evaluation (Addendum)
Anesthesia Pre-operative History and Physical for Martha Soto    ______________________________________________________________________________________    Summary:  Danise Mina presents preoperatively for anesthesia evaluation prior to breast mass excision, pt has never had anesthesia, no known FHX of anesthesia problems. She  has a past medical history of Allergy history unknown; Breast mass (09/05/2015); Headache(784.0); Ovarian cyst; GERD, Allergies.    By Rosary Lively, PA at 7:27 AM on 09/05/2015    Proceduralist's Site Exam (from Proceduralist's notes):  19 y.o. female with Lump in female breast (N63) presenting for Procedure(s):   Left BREAST MASS EXCISION by Surgeon(s):  Nolon Lennert, MD scheduled for 60 minutes.      Anesthesia Evaluation Information Source: records, patient     ANESTHESIA  Pertinent(-):  Family Hx of Anesthetic ComplicationsHistory of anesthetic complications: pt has never had anesthesia.    GENERAL     Denies general issues  Pertinent (-):  substance abuse, Family Hx of Anesthetic ComplicationsHistory of anesthetic complications: pt has never had anesthesia.    HEENT    + Corrective Eyewear            contacts  Pertinent (-):   neck pain PULMONARY    + Environmental Allergies  Pertinent(-): shortness of breath, recent URI, cough/congestion    CARDIOVASCULAR     Denies cardiovascular issues  Good(4+METs) Exercise Tolerance  Pertinent(-):  orthopnea, PND, DOE    Comment: regular exercise, plays sports     GI/HEPATIC/RENAL  Last PO Intake: >8hr before procedure and >2hr before procedure (clears)    + GERD    + Nausea (went to ED 8-22 16 fro GI symptoms, slowly improving)  Pertinent(-):  vomiting, bowel issues, urinary issues NEURO/PSYCH    + Headaches (chronic, takes Imitrex daily)            migraines and within last week  Pertinent(-):  dizziness/motion sickness, syncope, seizures    ENDO/OTHER     Denies endo issues    HEMALOGIC  Pertinent(-):  bruises/bleeds easily        Physical Exam    Airway            Mouth opening: normal            Mallampati: II            TM distance (fb): >3 FB            Neck ROM: full  Dental   Normal Exam   Cardiovascular  Normal Exam           Rhythm: regular           Rate: normal  No carotid bruit      General Survey    No rashes   Pulmonary     breath sounds clear to auscultation    No rhonchi, decreased breath sounds, wheezes, rales    Mental Status   Normal Exam    oriented to person, place and time       ________________________________________________________________________  Plan  ASA Score  2  Anesthetic Plan general    Induction (routine IV); General Anesthesia/Sedation Maintenance Plan (inhaled agents); Airway (LMA); Line ( use current access); Monitoring (standard ASA); Positioning (supine); PONV Plan (dexamethasone and ondansetron); Pain (per surgical team); PostOp (PACU)    Informed Consent     Risks:          Risks discussed were commensurate with the plan listed above with the following specific points: N/V, aspiration and sore throat , damage to:(eyes,  nerves, teeth), allergic Rx, unexpected serious injury, awareness    Anesthetic Consent:      Anesthetic plan (and risks as noted above) were discussed with patient    Attending Attestation:  As the primary attending anesthesiologist, I attest that the patient or proxy understands and accepts the risks and benefits of the anesthesia plan. I also attest that I have personally performed a pre-anesthetic examination and evaluation, and prescribed the anesthetic plan for this particular location within 48 hours prior to the anesthetic as documented.

## 2015-09-01 NOTE — Plan of Care (Signed)
Problem: Knowledge deficit related to pre or post-op regimens  Goal: Patient verbalizes understanding of PACU teaching  Outcome: Completed or Resolved Date Met:  09/01/15

## 2015-09-05 ENCOUNTER — Encounter
Admission: RE | Disposition: A | Discharge status: Home / Self Care | Payer: Self-pay | Source: Ambulatory Visit | Attending: Surgical Oncology

## 2015-09-05 ENCOUNTER — Encounter: Payer: Self-pay | Admitting: Family Medicine

## 2015-09-05 ENCOUNTER — Ambulatory Visit: Payer: Self-pay

## 2015-09-05 ENCOUNTER — Ambulatory Visit
Admission: RE | Admit: 2015-09-05 | Disposition: A | Discharge status: Home / Self Care | Payer: Self-pay | Source: Ambulatory Visit | Attending: Surgical Oncology | Admitting: Surgical Oncology

## 2015-09-05 ENCOUNTER — Encounter: Payer: Self-pay | Admitting: Surgical Oncology

## 2015-09-05 DIAGNOSIS — N63 Unspecified lump in unspecified breast: Secondary | ICD-10-CM

## 2015-09-05 HISTORY — DX: Unspecified lump in unspecified breast: N63.0

## 2015-09-05 HISTORY — PX: PR EXC CYST/ABERRANT BREAST TISSUE OPEN 1/> LESION: 19120

## 2015-09-05 HISTORY — PX: PR EXCISE BREAST CYST: 19120

## 2015-09-05 LAB — POCT URINE PREGNANCY: Lot #: 115654

## 2015-09-05 SURGERY — EXCISION, MASS, BREAST
Anesthesia: General | Site: Breast | Laterality: Left | Wound class: Clean

## 2015-09-05 MED ORDER — PROMETHAZINE HCL 25 MG/ML IJ SOLN *I*
6.2500 mg | Freq: Once | INTRAMUSCULAR | Status: DC | PRN
Start: 2015-09-05 — End: 2015-09-05

## 2015-09-05 MED ORDER — MIDAZOLAM HCL 1 MG/ML IJ SOLN *I* WRAPPED
INTRAMUSCULAR | Status: DC | PRN
Start: 2015-09-05 — End: 2015-09-05
  Administered 2015-09-05: 2 mg via INTRAVENOUS

## 2015-09-05 MED ORDER — PROPOFOL 10 MG/ML IV EMUL (INTERMITTENT DOSING) WRAPPED *I*
INTRAVENOUS | Status: DC | PRN
Start: 2015-09-05 — End: 2015-09-05
  Administered 2015-09-05: 150 mg via INTRAVENOUS

## 2015-09-05 MED ORDER — MIDAZOLAM HCL 1 MG/ML IJ SOLN *I* WRAPPED
INTRAMUSCULAR | Status: AC
Start: 2015-09-05 — End: 2015-09-05
  Filled 2015-09-05: qty 2

## 2015-09-05 MED ORDER — ACETAMINOPHEN 500 MG PO TABS *I*
1000.0000 mg | ORAL_TABLET | Freq: Once | ORAL | Status: DC
Start: 2015-09-05 — End: 2015-09-05

## 2015-09-05 MED ORDER — LIDOCAINE HCL 1 % IJ SOLN *I*
0.1000 mL | INTRAMUSCULAR | Status: DC | PRN
Start: 2015-09-05 — End: 2015-09-05
  Administered 2015-09-05: 5 mL via SUBCUTANEOUS
  Administered 2015-09-05: 0.1 mL via SUBCUTANEOUS

## 2015-09-05 MED ORDER — PROPOFOL 10 MG/ML IV EMUL (INTERMITTENT DOSING) WRAPPED *I*
INTRAVENOUS | Status: AC
Start: 2015-09-05 — End: 2015-09-05
  Filled 2015-09-05: qty 20

## 2015-09-05 MED ORDER — HALOPERIDOL LACTATE 5 MG/ML IJ SOLN *I*
0.5000 mg | Freq: Once | INTRAMUSCULAR | Status: DC | PRN
Start: 2015-09-05 — End: 2015-09-05

## 2015-09-05 MED ORDER — KETOROLAC TROMETHAMINE 30 MG/ML IJ SOLN *I*
INTRAMUSCULAR | Status: AC
Start: 2015-09-05 — End: 2015-09-05
  Filled 2015-09-05: qty 1

## 2015-09-05 MED ORDER — SODIUM CHLORIDE 0.9 % IV SOLN WRAPPED *I*
20.0000 mL/h | Status: DC
Start: 2015-09-05 — End: 2015-09-05

## 2015-09-05 MED ORDER — HYDROMORPHONE HCL PF 1 MG/ML IJ SOLN *WRAPPED*
0.4000 mg | INTRAMUSCULAR | Status: DC | PRN
Start: 2015-09-05 — End: 2015-09-05

## 2015-09-05 MED ORDER — HYDROCODONE-ACETAMINOPHEN 5-325 MG PO TABS *I*
1.0000 | ORAL_TABLET | ORAL | 0 refills | Status: AC | PRN
Start: 2015-09-05 — End: 2015-10-05

## 2015-09-05 MED ORDER — LACTATED RINGERS IV SOLN *I*
100.0000 mL/h | INTRAVENOUS | Status: DC
Start: 2015-09-05 — End: 2015-09-05

## 2015-09-05 MED ORDER — ONDANSETRON HCL 2 MG/ML IV SOLN *I*
INTRAMUSCULAR | Status: AC
Start: 2015-09-05 — End: 2015-09-05
  Filled 2015-09-05: qty 2

## 2015-09-05 MED ORDER — DEXAMETHASONE SODIUM PHOSPHATE 4 MG/ML INJ SOLN *WRAPPED*
INTRAMUSCULAR | Status: DC | PRN
Start: 2015-09-05 — End: 2015-09-05
  Administered 2015-09-05: 4 mg via INTRAVENOUS

## 2015-09-05 MED ORDER — LACTATED RINGERS IV SOLN *I*
20.0000 mL/h | INTRAVENOUS | Status: DC
Start: 2015-09-05 — End: 2015-09-05
  Administered 2015-09-05: 20 mL/h via INTRAVENOUS

## 2015-09-05 MED ORDER — DEXAMETHASONE SODIUM PHOSPHATE 4 MG/ML INJ SOLN *WRAPPED*
INTRAMUSCULAR | Status: AC
Start: 2015-09-05 — End: 2015-09-05
  Filled 2015-09-05: qty 1

## 2015-09-05 MED ORDER — LIDOCAINE HCL 1 % IJ SOLN *I*
INTRAMUSCULAR | Status: AC
Start: 2015-09-05 — End: 2015-09-05
  Filled 2015-09-05: qty 30

## 2015-09-05 MED ORDER — FENTANYL CITRATE 50 MCG/ML IJ SOLN *WRAPPED*
INTRAMUSCULAR | Status: AC
Start: 2015-09-05 — End: 2015-09-05
  Filled 2015-09-05: qty 2

## 2015-09-05 MED ORDER — LACTATED RINGERS IV SOLN *I*
125.0000 mL/h | INTRAVENOUS | Status: DC
Start: 2015-09-05 — End: 2015-09-05

## 2015-09-05 MED ORDER — HYDROCODONE-ACETAMINOPHEN 5-325 MG PO TABS *I*
1.0000 | ORAL_TABLET | Freq: Once | ORAL | Status: AC | PRN
Start: 2015-09-05 — End: 2015-09-05
  Administered 2015-09-05: 1 via ORAL
  Filled 2015-09-05: qty 1

## 2015-09-05 MED ORDER — BUPIVACAINE-EPINEPHRINE 0.25 % IJ SOLUTION *WRAPPED*
INTRAMUSCULAR | Status: AC
Start: 2015-09-05 — End: 2015-09-05
  Filled 2015-09-05: qty 30

## 2015-09-05 MED ORDER — LIDOCAINE HCL 1 % IJ SOLN *I*
INTRAMUSCULAR | Status: DC | PRN
Start: 2015-09-05 — End: 2015-09-05
  Administered 2015-09-05: 2.5 mL via INTRAMUSCULAR

## 2015-09-05 MED ORDER — FENTANYL CITRATE 50 MCG/ML IJ SOLN *WRAPPED*
INTRAMUSCULAR | Status: DC | PRN
Start: 2015-09-05 — End: 2015-09-05
  Administered 2015-09-05: 08:00:00 100 ug via INTRAVENOUS

## 2015-09-05 MED ORDER — LIDOCAINE HCL 2 % (PF) IJ SOLN *I*
INTRAMUSCULAR | Status: AC
Start: 2015-09-05 — End: 2015-09-05
  Filled 2015-09-05: qty 5

## 2015-09-05 SURGICAL SUPPLY — 19 items
ADHESIVE SKIN CLOSURE 0.7ML DERMABOND ADVANCED (Dressing) ×2 IMPLANT
DRAPE BREAST/CHEST 15X10 FENESTRATED (Drape) ×2 IMPLANT
DRAPE SHEET 40X70 MED (Drape) ×2 IMPLANT
GLOVE SURG PROTEGRITY SMT SZ6.5 PF LTX (Glove) ×18 IMPLANT
NEEDLE HYPO LF 22G X 1.5IN BLACK (Needle) ×2 IMPLANT
PACK CUSTOM GENERAL PACK (Pack) ×2 IMPLANT
PACK MINOR LINEN (Other) ×1
PACK SURGICAL PROCEDURE LINEN MINOR (Other) ×1 IMPLANT
SOL CHG SCRUB 4 OZ (Other) ×1
SOL SODIUM CHLORIDE IRRIG 1000ML BTL (Solution) ×2 IMPLANT
SOL WATER IRRIG STERILE 1000ML BTL (Solution) ×2 IMPLANT
SOLUTION SUR PREP 4OZ 4% CHG BTL EXIDINE (Other) ×1 IMPLANT
SUTR MONCRYL 3-0 PS-2 UND 27IN (Suture) ×2 IMPLANT
SUTR SILK 2-0 SH 30 IN BLACK (Suture) ×2 IMPLANT
SUTR VICRYL ANTIB 3-0 SH 27 UNDY (Suture) ×2 IMPLANT
SUTR VICRYL CTD 2-0 SUTUPAK VIOLET (Suture) IMPLANT
SYRINGE LUERLOCK CNTL 10CC (Supply) ×2 IMPLANT
TRAY PATIENT PREP (Tray) ×1
TRAY PREP SKIN INCL 8 DRY GZ PD TWO 6IN COT TIP APPL 2 STK SPNG (Tray) ×1 IMPLANT

## 2015-09-05 NOTE — Progress Notes (Addendum)
Pt took awhile to recover from medications given in surgery, she was quite sleepy and "loopy" and teary. She needed extra support and frequent reassurance. She was finally able to settle down and sleep for awhile. She was oriented once she awakened, and was able to go over and understand discharge instructions.

## 2015-09-05 NOTE — Discharge Instructions (Signed)
Discharge Instructions    Surgeon: Nolon Lennert, MD     Date of Discharge: 09/05/2015    Procedure: Left breast mass excision    Diet: general    Activity: No restrictions, Use ice pack and Rest today  Please rest after surgery, avoiding any strenuous activity. In 1-2 weeks you may resume regular activity as tolerated. Due to the sedation medication and or general anesthesia that you have been given today, please follow these instructions    -Do not drive or operate any machinery for 24 hours or the specified time frame that was reccommended by your doctor, please refer to the post op instructions listed further down  -Do not drink any alcoholic beverages for 24 hours after your procedure and/if you are taking narcotic pain relievers  -Do not make any major decisions or sign any contracts with in 24 hours  -Diet: please begin with liquids and advance as tolerated    Medications: Resume home medications.    Wound care: keep wound clean and dry and ice to area for comfort   Your incision has been covered with skin glue. This will fall off on its own eventually. You may shower 48 hours after surgery. Please refrain from scrubbing/soaking incision site. Please refrain from using hot tubs, baths, or swimming pools until cleared by surgeon to do so    Call your provider at above number if you develop any of the following symptoms:  Fever of 101F. or greater  Shaking chills  Nausea and / or vomiting  Uncontrolled pain  Increased redness, drainage or swelling from the incision site    Follow-up: Stay in touch with office to schedule appointment in about two weeks.

## 2015-09-05 NOTE — INTERIM OP NOTE (Signed)
Interim Op Note (Surgical Log ID: [id number])       Date of Surgery: 09/05/2015       Surgeons: Surgeon(s) and Role:     * Nolon Lennert, MD - Primary       Pre-op Diagnosis: Pre-Op Diagnosis Codes:     * Lump in female breast [N63]       Post-op Diagnosis: Post-Op Diagnosis Codes:     * Lump in female breast [N63]       Procedure(s) Performed: Procedure:    Left BREAST MASS EXCISION  CPT(R) Code:  04540 - PR EXCISE BREAST CYST         Additional CPT Codes:        Anesthesia Type: General        Fluid Totals: I/O this shift:  09/06 0700 - 09/06 1459  In: 400 (7.3 mL/kg) [I.V.:400]  Out: 5 (0.1 mL/kg) [Blood:5]  Net: 395  Weight: 54.7 kg        Estimated Blood Loss: Blood Loss: 5 mL       Specimens to Pathology:    ID Type Source Tests Collected by Time Destination   A : Left breast mass TISSUE Breast SURGICAL PATHOLOGY Neill Loft, RN 09/05/2015 9811           Temporary Implants:        Packing:                 Patient Condition: good       Findings (Including unexpected complications): none     Signed:  Rosalita Levan, PA  on 09/05/2015 at 9:11 AM

## 2015-09-05 NOTE — Anesthesia Case Conclusion (Signed)
CASE CONCLUSION  Emergence  Actions:  Suctioned and LMA removed  Criteria Used for Airway Removal:  Adequate Tv & RR  Assessment:  Routine  Transport  Directly to: PACU  Airway:  Nasal cannula  Oxygen Delivery:  2 lpm  Position:  Supine  Patient Condition on Handoff  Level of Consciousness:  Mildly sedated  Patient Condition:  Stable  Handoff Report to:  RN

## 2015-09-05 NOTE — Interval H&P Note (Signed)
UPDATES TO PATIENT'S CONDITION on the DAY OF SURGERY/PROCEDURE    I. Updates to Patient's Condition (to be completed by a provider privileged to complete a H&P, following reassessment of the patient by the provider):    Day of Surgery/Procedure Update:  History  History reviewed and no change    Physical  Physical exam updated and no change            II. Procedure Readiness   I have reviewed the patient's H&P and updated condition. By completing and signing this form, I attest that this patient is ready for surgery/procedure.    III. Attestation   I have reviewed the updated information regarding the patient's condition and it is appropriate to proceed with the planned surgery/procedure.    Ginna Schuur Guss Bunde, MD as of 8:19 AM 09/05/2015

## 2015-09-05 NOTE — Anesthesia Procedure Notes (Signed)
---------------------------------------------------------------------------------------------------------------------------------------    AIRWAY   GENERAL INFORMATION AND STAFF    Patient location during procedure: OR       Date of Procedure: 09/05/2015 8:59 AM  CONDITION PRIOR TO MANIPULATION     Current Airway/Neck Condition:  Normal        For more airway physical exam details, see Anesthesia PreOp Evaluation  AIRWAY METHOD     Patient Position:  Sniffing    Preoxygenated: yes      Induction: IV  Mask Difficulty Assessment:  0 - not attempted    Number of Attempts at Approach:  1    Number of Other Approaches Attempted:  0  FINAL AIRWAY DETAILS    Final Airway Type:  LMA    Final LMA: classic    LMA Size: 3  ----------------------------------------------------------------------------------------------------------------------------------------

## 2015-09-05 NOTE — Anesthesia Postprocedure Evaluation (Signed)
Anesthesia Post-Op Note    Patient: Martha Soto    Procedure(s) Performed:  Procedure Summary     Date Anesthesia Start Anesthesia Stop Room / Location    09/05/15 0825 0910 [id number] / [facility] MAIN OR       Procedure Diagnosis Surgeon Attending Anesthesia    Left BREAST MASS EXCISION (Left Breast) Lump in female breast  (Lump in female breast [N63]) Nolon Lennert, MD Marcille Buffy, DO        Recovery Vitals  BP: 106/55 (09/05/2015 11:15 AM)  Heart Rate: 68 (09/05/2015 11:15 AM)  Resp: 16 (09/05/2015 10:45 AM)  Temp: 37 C (98.6 F) (09/05/2015  9:12 AM)  SpO2: 100 % (09/05/2015 11:15 AM)   0-10 Scale: 8 (09/05/2015  9:45 AM)  Anesthesia type:  General  Complications Noted During Procedure or in PACU:  None   Comment:    Patient Location:  PACU  Level of Consciousness:    Recovered to baseline  Patient Participation:     Able to participate  Temperature Status:    Normothermic  Oxygen Saturation:    Within patient's normal range  Cardiac Status:   Within patient's normal range  Fluid Status:    Stable  Airway Patency:     Yes  Pulmonary Status:    Baseline  Pain Management:    Adequate analgesia  Nausea and Vomiting:  None    Post Op Assessment:    Tolerated procedure well   Attending Attestation:  All indicated post anesthesia care provided     -

## 2015-09-05 NOTE — H&P (View-Only) (Signed)
Anesthesia Pre-operative History and Physical for Martha Soto    ______________________________________________________________________________________    Summary:  Martha Soto presents preoperatively for anesthesia evaluation prior to breast mass excision, pt has never had anesthesia, no known FHX of anesthesia problems. She  has a past medical history of Allergy history unknown; Breast mass (09/05/2015); Headache(784.0); Ovarian cyst; GERD, Allergies.    By Rosary Lively, PA at 7:27 AM on 09/05/2015    Proceduralist's Site Exam (from Proceduralist's notes):  19 y.o. female with Lump in female breast (N63) presenting for Procedure(s):   Left BREAST MASS EXCISION by Surgeon(s):  Nolon Lennert, MD scheduled for 60 minutes.      Anesthesia Evaluation Information Source: records     ANESTHESIA  Pertinent(-):  Family Hx of Anesthetic ComplicationsHistory of anesthetic complications: pt has never had anesthesia.    GENERAL     Denies general issues  Pertinent (-):  substance abuse, Family Hx of Anesthetic ComplicationsHistory of anesthetic complications: pt has never had anesthesia.    HEENT    + Corrective Eyewear            contacts  Pertinent (-):   neck pain PULMONARY    + Environmental Allergies  Pertinent(-): shortness of breath, recent URI, cough/congestion    CARDIOVASCULAR     Denies cardiovascular issues  Good(4+METs) Exercise Tolerance  Pertinent(-):  orthopnea, PND, DOE    Comment: regular exercise, plays sports     GI/HEPATIC/RENAL  Last PO Intake: >8hr before procedure and >2hr before procedure (clears)    + GERD    + Nausea (went to ED 8-22 16 fro GI symptoms, slowly improving)  Pertinent(-):  vomiting, bowel issues, urinary issues NEURO/PSYCH    + Headaches (chronic, takes Imitrex daily)            migraines and within last week  Pertinent(-):  dizziness/motion sickness, syncope, seizures    ENDO/OTHER     Denies endo issues    HEMALOGIC  Pertinent(-):  bruises/bleeds easily        Physical Exam    Airway            Mouth opening: normal            Neck ROM: full     Cardiovascular           Rhythm: regular           Rate: normal  No carotid bruit      General Survey    No rashes   Pulmonary     breath sounds clear to auscultation    No rhonchi, decreased breath sounds, wheezes, rales    Mental Status     oriented to person, place and time       ________________________________________________________________________  Plan  ASA Score  2  Anesthetic Plan general    Induction (routine IV); General Anesthesia/Sedation Maintenance Plan (inhaled agents); Airway (LMA); Line ( use current access); Monitoring (standard ASA); Positioning (supine); PONV Plan (dexamethasone and ondansetron); Pain (per surgical team); PostOp (PACU)    Informed Consent     Attending Attestation:  As the primary attending anesthesiologist, I attest that the patient or proxy understands and accepts the risks and benefits of the anesthesia plan. I also attest that I have personally performed a pre-anesthetic examination and evaluation, and prescribed the anesthetic plan for this particular location within 48 hours prior to the anesthetic as documented.

## 2015-09-05 NOTE — Op Note (Signed)
Martha Soto, BAITY MR #:  [id number]   ACCOUNT #:  [id number] DOB:  28-Sep-1996    AGE:  18     SURGEON:  Ferrel Logan, MD  CO-SURGEON:    ASSISTANT:  Ruthell Rummage, [place].  SURGERY DATE:  09/05/2015    PREOPERATIVE DIAGNOSIS:  Left breast mass.    POSTOPERATIVE DIAGNOSIS:  Left breast mass.    OPERATIVE PROCEDURE:  Excision of left breast mass.    ANESTHESIA:  General by LMA.    ESTIMATED BLOOD LOSS:  Less than 1 cc.    COMPLICATIONS:  None.    SPECIMENS:  Left breast mass.    BRIEF HISTORY:  The patient is a 19 year old female, who developed a left breast mass.  It was enlarging and becoming increasingly tender, so excision was planned.  Consent for the procedure was obtained and on the chart at the time of surgery.  The patient marked the site herself and it was confirmed by me prior to proceeding to the operating room.    DESCRIPTION OF PROCEDURE:  The patient was brought to the operating room and placed in the supine position on the operating table with the arms extended.  After adequate induction of general anesthesia, the left breast was prepped and draped in the usual sterile fashion.  The mass was present in the periareolar position at about 8 o'clock.  Local anesthetic consisting of 0.25% Marcaine were infiltrated into the proposed surgical site.  An incision was then created along the border of the areola and extended down until the breast tissue was encountered.  The mass was then isolated and the tissues surrounding the mass were divided to free the specimen.  It was sent to pathology unoriented.  Hemostasis was assured using electrocautery and the cavity irrigated.  The deep dermal tissues were closed with interrupted Vicryl suture and the skin closed with running 4-0 Monocryl.  The breast was then cleansed and Dermabond applied.  The patient tolerated the procedure well and was brought to the recovery room in good condition.  Sponge, instrument and needle counts were correct x2 at the  end.             ______________________________  Ferrel Logan, MD    AO/MODL  DD:  09/05/2015 09:00:03  DT:  09/05/2015 09:13:06  Job #:  [id number]    cc:

## 2015-09-08 ENCOUNTER — Encounter: Payer: Self-pay | Admitting: Surgical Oncology

## 2015-09-08 LAB — SURGICAL PATHOLOGY

## 2016-01-25 ENCOUNTER — Encounter: Payer: Self-pay | Admitting: Emergency Medicine

## 2016-01-25 ENCOUNTER — Emergency Department
Admission: EM | Admit: 2016-01-25 | Disposition: A | Discharge status: Home / Self Care | Payer: Self-pay | Source: Ambulatory Visit

## 2016-01-25 LAB — CBC AND DIFFERENTIAL
Baso # K/uL: 0 10*3/uL (ref 0.0–0.1)
Basophil %: 0.4 %
Eos # K/uL: 0.1 10*3/uL (ref 0.0–0.4)
Eosinophil %: 1.1 %
Hematocrit: 40 % (ref 34–45)
Hemoglobin: 13.7 g/dL (ref 11.2–15.7)
IMM Granulocytes #: 0 10*3/uL (ref 0.0–0.1)
IMM Granulocytes: 0.2 %
Lymph # K/uL: 3.8 10*3/uL — ABNORMAL HIGH (ref 1.2–3.7)
Lymphocyte %: 39.9 %
MCH: 29 pg/cell (ref 26–32)
MCHC: 34 g/dL (ref 32–36)
MCV: 85 fL (ref 79–95)
Mono # K/uL: 0.7 10*3/uL (ref 0.2–0.9)
Monocyte %: 6.9 %
Neut # K/uL: 4.9 10*3/uL (ref 1.6–6.1)
Nucl RBC # K/uL: 0 10*3/uL (ref 0.0–0.0)
Nucl RBC %: 0 /100 WBC (ref 0.0–0.2)
Platelets: 237 10*3/uL (ref 160–370)
RBC: 4.8 MIL/uL (ref 3.9–5.2)
RDW: 12.7 % (ref 11.7–14.4)
Seg Neut %: 51.5 %
WBC: 9.4 10*3/uL (ref 4.0–10.0)

## 2016-01-25 LAB — HOLD SST

## 2016-01-25 LAB — PLASMA PROF 7 (ED ONLY)
Anion Gap,PL: 16 (ref 7–16)
CO2,Plasma: 20 mmol/L (ref 20–28)
Chloride,Plasma: 102 mmol/L (ref 96–108)
Creatinine: 0.81 mg/dL (ref 0.50–1.00)
GFR,Black: 121 *
GFR,Caucasian: 105 *
Glucose,Plasma: 77 mg/dL (ref 60–99)
Potassium,Plasma: 3.8 mmol/L (ref 3.4–4.7)
Sodium,Plasma: 138 mmol/L (ref 133–145)
UN,Plasma: 16 mg/dL (ref 6–20)

## 2016-01-25 LAB — PREGNANCY TEST, SERUM: Preg,Serum: NEGATIVE

## 2016-01-25 LAB — BLOOD BANK HOLD RED

## 2016-01-25 LAB — HOLD LAVENDER

## 2016-01-25 LAB — HOLD BLUE

## 2016-01-25 MED ORDER — PROMETHAZINE HCL 12.5 MG PO TABS *I*
12.5000 mg | ORAL_TABLET | ORAL | 0 refills | Status: AC | PRN
Start: 2016-01-25 — End: 2016-01-29

## 2016-01-25 MED ORDER — PROMETHAZINE HCL 25 MG/ML IJ SOLN *I*
12.5000 mg | Freq: Once | INTRAMUSCULAR | Status: AC
Start: 2016-01-25 — End: 2016-01-25
  Administered 2016-01-25: 12.5 mg via INTRAVENOUS
  Filled 2016-01-25: qty 1

## 2016-01-25 MED ORDER — HYDROMORPHONE HCL PF 1 MG/ML IJ SOLN *WRAPPED*
1.0000 mg | Freq: Once | INTRAMUSCULAR | Status: AC
Start: 2016-01-25 — End: 2016-01-25
  Administered 2016-01-25: 1 mg via INTRAVENOUS
  Filled 2016-01-25: qty 1

## 2016-01-25 MED ORDER — OXYCODONE-ACETAMINOPHEN 5-325 MG PO TABS *I*
1.0000 | ORAL_TABLET | Freq: Four times a day (QID) | ORAL | 0 refills | Status: AC | PRN
Start: 2016-01-25 — End: 2016-01-28

## 2016-01-25 MED ORDER — IOHEXOL 350 MG/ML (OMNIPAQUE) IV SOLN *I*
1.0000 mL | Freq: Once | INTRAVENOUS | Status: AC
Start: 2016-01-25 — End: 2016-01-25
  Administered 2016-01-25: 76 mL via INTRAVENOUS
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
## 2016-01-26 ENCOUNTER — Encounter: Payer: Self-pay | Admitting: Orthopedic Surgery

## 2016-01-26 ENCOUNTER — Ambulatory Visit: Payer: Self-pay | Admitting: Orthopedic Surgery

## 2016-01-26 VITALS — BP 116/59 | HR 76 | Ht 61.0 in | Wt 120.0 lb

## 2016-01-26 DIAGNOSIS — M25512 Pain in left shoulder: Secondary | ICD-10-CM
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
## 2016-01-29 ENCOUNTER — Encounter: Payer: Self-pay | Admitting: Emergency Medicine

## 2016-01-29 ENCOUNTER — Emergency Department
Admission: EM | Admit: 2016-01-29 | Disposition: A | Discharge status: Home / Self Care | Payer: Self-pay | Source: Ambulatory Visit | Attending: Emergency Medicine | Admitting: Emergency Medicine

## 2016-01-29 ENCOUNTER — Telehealth: Payer: Self-pay | Admitting: Orthopedic Surgery

## 2016-01-29 MED ORDER — CYCLOBENZAPRINE HCL 10 MG PO TABS *I*
5.0000 mg | ORAL_TABLET | Freq: Once | ORAL | Status: AC
Start: 2016-01-29 — End: 2016-01-29
  Administered 2016-01-29: 5 mg via ORAL
  Filled 2016-01-29: qty 1

## 2016-01-29 MED ORDER — OXYCODONE-ACETAMINOPHEN 5-325 MG PO TABS *I*
2.0000 | ORAL_TABLET | Freq: Once | ORAL | Status: DC
Start: 2016-01-29 — End: 2016-01-29

## 2016-01-29 MED ORDER — CYCLOBENZAPRINE HCL 5 MG PO TABS *I*
5.0000 mg | ORAL_TABLET | Freq: Three times a day (TID) | ORAL | 0 refills | Status: AC | PRN
Start: 2016-01-29 — End: 2016-02-03

## 2016-01-29 MED ORDER — OXYCODONE-ACETAMINOPHEN 5-325 MG PO TABS *I*
1.0000 | ORAL_TABLET | Freq: Four times a day (QID) | ORAL | 0 refills | Status: AC | PRN
Start: 2016-01-29 — End: 2016-02-03

## 2016-01-29 MED ORDER — ONDANSETRON 4 MG PO TBDP *I*
4.0000 mg | ORAL_TABLET | Freq: Once | ORAL | Status: DC
Start: 2016-01-29 — End: 2016-01-30

## 2016-01-29 MED ORDER — OXYCODONE-ACETAMINOPHEN 5-325 MG PO TABS *I*
1.0000 | ORAL_TABLET | Freq: Once | ORAL | Status: AC
Start: 2016-01-29 — End: 2016-01-29
  Administered 2016-01-29: 1 via ORAL
  Filled 2016-01-29: qty 1

## 2016-01-29 MED ORDER — ONDANSETRON 4 MG PO TBDP *I*
4.0000 mg | ORAL_TABLET | Freq: Three times a day (TID) | ORAL | 0 refills | Status: DC | PRN
Start: 2016-01-29 — End: 2018-01-08
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
## 2016-01-29 NOTE — Discharge Instructions (Signed)
Please continue taking ibuprofen for the next 2-3 days.  You were given a prescription for Flexeril which is muscle relaxer.  This medicine may help the pain you are experiencing.  These take this up to 3 times a day.  You're also given a prescription for oxycodone.  This medication is an opiate medicine and should be taken only if the other medications do not help.  Do not drive or operate machinery while taking Flexeril or oxycodone because they can make you drowsy.

## 2016-01-29 NOTE — Telephone Encounter (Signed)
Patient tried to take ibuprofen for the pain, she tried the  and then she upped it to  neither dosing worked to help her with the pain.    The ER had prescribed her, percocet which did work for her, they also prescribed her nausea medication which helped her to not be sick form the percocet.    She is wondering if she can get refill's with you?    Please advise

## 2016-01-30 MED ORDER — TRAMADOL HCL 50 MG PO TABS *I*
100.0000 mg | ORAL_TABLET | Freq: Three times a day (TID) | ORAL | 0 refills | Status: AC | PRN
Start: 2016-01-30 — End: ?

## 2016-01-30 NOTE — Telephone Encounter (Signed)
Patient has been notified of new prescription, she wanted me to let you know that she was in the ED last night for the pain.  She states that she was having terrible spasms.

## 2016-01-30 NOTE — Telephone Encounter (Signed)
I do not prescribe Percocet.  A pain medication was sent to the pharmacy for her.  Thank you.

## 2016-01-31 NOTE — Telephone Encounter (Signed)
Ok noted.  She needs to see non op spine for assessment,  Has appt this week.

## 2016-02-01 ENCOUNTER — Ambulatory Visit: Payer: Self-pay | Admitting: Orthopedic Surgery

## 2016-02-08 ENCOUNTER — Ambulatory Visit: Payer: Self-pay | Admitting: Orthopedic Surgery

## 2016-02-08 ENCOUNTER — Encounter: Payer: Self-pay | Admitting: Orthopedic Surgery

## 2016-02-08 ENCOUNTER — Ambulatory Visit: Payer: Self-pay

## 2016-02-08 VITALS — BP 117/72 | HR 74 | Ht 61.0 in | Wt 122.0 lb

## 2016-02-08 DIAGNOSIS — S161XXA Strain of muscle, fascia and tendon at neck level, initial encounter: Secondary | ICD-10-CM

## 2016-02-08 DIAGNOSIS — S161XXD Strain of muscle, fascia and tendon at neck level, subsequent encounter: Secondary | ICD-10-CM

## 2016-02-08 MED ORDER — MELOXICAM 15 MG PO TABS *I*
15.0000 mg | ORAL_TABLET | Freq: Every day | ORAL | 1 refills | Status: AC
Start: 2016-02-08 — End: ?

## 2016-02-08 MED ORDER — CYCLOBENZAPRINE HCL 5 MG PO TABS *I*
5.0000 mg | ORAL_TABLET | Freq: Every evening | ORAL | 1 refills | Status: AC | PRN
Start: 2016-02-08 — End: ?
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
## 2016-02-08 NOTE — Patient Instructions (Signed)
The Patient was provided with the following items:  Orthosis  Side: N/A  Size:: SM  Model:: Soft Collar  Manufacturer: Cascade  Part Number: 9977/A-S  Warranty:: 90 Days  Quantity: 1  Status: Delivered  [facility]      Your physician has determined that you require Prefabricated (off-the-shelf) Orthotic and Prosthetic (O&P) devices and/or Durable Medical Equipment (DME).  O&P devices include items such as braces for the spine or limbs, while DME includes items such as canes, crutches and walkers.  For your convenience you were fitted by the [facility] Department of Orthotics and Prosthetics.    Return Policy:     Prefabricated O&P and DME items are not returnable if used outside of clinic due to hygiene concerns.     Special or custom orders are not returnable or refundable.     O&P devices and DME purchased from the [facility] Department may only be exchanged if the item is faulty or poorly fitting, as determined by the O&P Department Clinical Coordinator.  Exchanges will be made using the same type of device, if within 7 days of the purchase date.     No exchange will be issued for items that were not used in accordance with the manufacturers recommended standard of care.     Replacement or repair of minor parts could become necessary due to wear.  This may include a charge and an order from you physician may be required.

## 2016-02-08 NOTE — Patient Instructions (Signed)
February 08, 2016  Edlin Ford  [birth year]    You have been prescribed a medication that is classified as an NSAID, below is further information about this class of medication.  MELOXICAM    What are nonsteroidal antiinflammatory drugs? -- Nonsteroidal anti-inflammatory drugs, also called NSAIDs, are medicines that relieve pain and reduce inflammation. They are one of the most commonly used kinds of medicines.    NSAIDs can help people who have conditions that cause ongoing pain, such as arthritis. They can also help people heal more quickly after an injury. But NSAIDs can cause problems of their own, so its important to take the lowest dose you need for the shortest time.    You can buy many NSAIDs without a prescription, including aspirin, ibuprofen (sample brand names: Advil, Motrin), and naproxen (brand name: Aleve). These same NSAIDs can also be prescribed by a doctor, usually at a higher strength. Plus, there are many other prescription-strength NSAIDs.    Are all NSAIDs the same? -- Yes and no. All NSAIDs work on the same chemical process in the body, but they do it in different ways. Some NSAIDs need to be taken more often during the day than others to work for certain kinds of pain. And some are more likely than others to cause certain side effects.    Are NSAIDs safe for everyone? -- No. People with certain medical conditions should avoid NSAIDs or use them with care. Talk with your doctor if you use NSAIDs without a prescription (called over-the-counter) on a regular basis to be sure they are safe for you.    ?If you have an ulcer in your stomach or intestine or you have ever had bleeding in the gut, ask your doctor if NSAIDs are safe for you. Your doctor might suggest that you take an NSAID along with a medicine that can protect your stomach and intestines. Thats because NSAIDs can damage your stomach or intestines.     ?If you have heart disease or ever had a stroke, ask your doctor if it is  safe to take an NSAID. In people with these problems, some NSAIDs can increase the risk of heart attacks and strokes. But if your doctor prescribes low-dose aspirin to prevent heart attacks or stroke, you should take it as directed. At low doses, aspirin can actually protect you from these problems.    ?If you have kidney disease, heart failure, cirrhosis, or you take medicines called diuretics (also called water pills), avoid NSAIDs completely. NSAIDs can make heart failure, kidney disease, and cirrhosis worse. NSAIDs can also cause kidney problems in people taking diuretics or certain other medicines to control blood pressure. This can happen in people with very mild kidney disease or in older people.     ?If you have high blood pressure, ask your doctor if NSAIDs are safe for you. NSAIDs can raise blood pressure even in people who are taking medicine for high blood pressure. Experts recommend that people with high blood pressure who need NSAIDs take the lowest possible dose for the shortest amount of time.    ?If you have any disorders that increase the risk of bleeding, ask your doctor if NSAIDs are safe for you.     ?If you are having surgery, ask your doctor if you should stop taking NSAIDs. Most people need to stop NSAIDs, including aspirin, a week before surgery to lower the risk of bleeding.     ?If you are pregnant, avoid NSAIDs during the  last 3 months of pregnancy. They are probably safe to use when breastfeeding, but check with your doctor or nurse if you are breastfeeding.     ?If you take any other prescription or non-prescription medicines, or you take any herbal medicines, ask your doctor or pharmacist if NSAIDs are safe for you. This is especially important if you take:  Blood thinners, such as warfarin (brand name: Coumadin) or heparin  Phenytoin (brand names: Dilantin, Phenytek), a medicine used to prevent seizures   Cyclosporine, a medicine given to people who have had an organ transplant      What side effects can NSAIDs cause? -- In most cases, NSAIDs cause no side effects. The side effects they do cause can include:    ?Stomach upset, ulcers, and bleeding - NSAIDs can cause stomach upset. If you take them regularly for a long time, NSAIDs can also cause ulcers or bleeding in the stomach or intestines.    ?Liver damage - Long-term use of NSAIDs, especially at high doses, can harm the liver.     ?Kidney damage - Using NSAIDs, even for a short time, can harm the kidneys. They are especially risky in people who already have kidney disease.     ?Ringing in the ears - Ringing in the ears (tinnitus) is common in people who take high doses of aspirin. It can also happen in people who take other NSAIDs. The ringing usually goes away when they take a lower dose.    What happens if I take more than the recommended dose? -- Taking more than the recommended dose of an NSAID might not cause serious problems. But it could make side effects much more likely without helping your symptoms very much. On the other hand, taking too much aspirin or acetaminophen (sample brand name: Tylenol) can be harmful or even cause death.    Anybody who takes too much of any medicine at once should call a doctor or the [facility] [phone]). If the person is not breathing or is not conscious, call for an ambulance (in the [place] and [place], [place] 9-1-1).
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
## 2016-02-13 ENCOUNTER — Ambulatory Visit: Payer: Self-pay | Admitting: Orthopedic Surgery

## 2017-08-01 ENCOUNTER — Encounter: Payer: Self-pay | Admitting: Emergency Medicine

## 2017-08-01 ENCOUNTER — Emergency Department
Admission: EM | Admit: 2017-08-01 | Discharge: 2017-08-01 | Disposition: A | Discharge status: Home / Self Care | Payer: Medicaid Other | Source: Ambulatory Visit | Attending: Emergency Medicine | Admitting: Emergency Medicine

## 2017-08-01 DIAGNOSIS — N3000 Acute cystitis without hematuria: Secondary | ICD-10-CM

## 2017-08-01 DIAGNOSIS — R300 Dysuria: Secondary | ICD-10-CM

## 2017-08-01 DIAGNOSIS — R110 Nausea: Secondary | ICD-10-CM

## 2017-08-01 DIAGNOSIS — R1032 Left lower quadrant pain: Secondary | ICD-10-CM | POA: Insufficient documentation

## 2017-08-01 DIAGNOSIS — N83202 Unspecified ovarian cyst, left side: Secondary | ICD-10-CM

## 2017-08-01 DIAGNOSIS — Z3202 Encounter for pregnancy test, result negative: Secondary | ICD-10-CM | POA: Insufficient documentation

## 2017-08-01 LAB — POCT URINALYSIS DIPSTICK
Glucose,UA POCT: NORMAL mg/dL
Ketones,UA POCT: NEGATIVE mg/dL
Leuk Esterase,UA POCT: NEGATIVE
Lot #: 28811703
Nitrite,UA POCT: NEGATIVE
PH,UA POCT: 6 (ref 5–8)

## 2017-08-01 LAB — POCT URINE PREGNANCY: Lot #: 7110176

## 2017-08-01 MED ORDER — SULFAMETHOXAZOLE-TRIMETHOPRIM 800-160 MG PO TABS *I*
1.0000 | ORAL_TABLET | Freq: Two times a day (BID) | ORAL | 0 refills | Status: AC
Start: 2017-08-01 — End: 2017-08-04

## 2017-08-01 MED ORDER — KETOROLAC TROMETHAMINE 10 MG PO TABS *I*
10.0000 mg | ORAL_TABLET | Freq: Four times a day (QID) | ORAL | 0 refills | Status: AC | PRN
Start: 2017-08-01 — End: 2017-08-06

## 2017-08-01 NOTE — ED Provider Notes (Signed)
History     Chief Complaint   Patient presents with    Abdominal Pain       History provided by:  Patient and medical records  21 year old female reports lower abdominal pain more pronounced on the left.  She took Flexeril which provided some moderate relief.  She suspects it may be a ruptured ovarian cyst.  She also describes dysuria and frequency  Symptoms started earlier today    Medical/Surgical/Family History     Past Medical History:   Diagnosis Date    Allergy history unknown     seasonal    Breast mass 09/05/2015    Headache(784.0)     Ovarian cyst     Vision abnormalities     wears contacts        Patient Active Problem List   Diagnosis Code    Ovarian cyst rupture N83.209    Abdominal pain R10.9    Headache(784.0) R51    Breast mass N63.0            Past Surgical History:   Procedure Laterality Date    PR EXCISE BREAST CYST Left 09/05/2015    Procedure: Left BREAST MASS EXCISION;  Surgeon: Nolon Lennertlzinski, Ann Therese, MD;  Location: [facility]IN OR;  Service: Oncology General    WISDOM TOOTH EXTRACTION       Family History   Problem Relation Age of Onset    Hypertension Mother     Migraines Mother     Diabetes Maternal Grandfather     Learning disabilities Maternal Uncle           Social History   Substance Use Topics    Smoking status: Never Smoker    Smokeless tobacco: Never Used    Alcohol use No     Living Situation     Questions Responses    Patient lives with Family    Homeless     Caregiver for other family member     External Services     Employment Employed    Domestic Violence Risk                 Review of Systems   Review of Systems   Gastrointestinal: Positive for abdominal pain and nausea.   Genitourinary: Positive for dysuria. Negative for menstrual problem.   All other systems reviewed and are negative.      Physical Exam     Triage Vitals  Triage Start: Start, (08/01/17 1505)   First Recorded BP: 117/72, Resp: 17, Temp: 36.7 C (98.1 F), Temp src: TEMPORAL Oxygen Therapy SpO2: 100  %, Oximetry Source: Rt Hand, O2 Device: None (Room air), Heart Rate: 97, (08/01/17 1505)  .  First Pain Reported  0-10 Scale: 5, Pain Location/Orientation: Abdomen, (08/01/17 1505)       Physical Exam   Constitutional: She is oriented to person, place, and time. No distress.   HENT:   Head: Normocephalic and atraumatic.   Eyes: Right eye exhibits no discharge. Left eye exhibits no discharge. No scleral icterus.   Neck: Neck supple.   Cardiovascular: Normal rate, regular rhythm and normal heart sounds.    Pulmonary/Chest: Effort normal and breath sounds normal. No respiratory distress.   Abdominal: Bowel sounds are normal. She exhibits no distension and no mass. There is tenderness in the left lower quadrant. There is no rigidity, no rebound, no guarding, no tenderness at McBurney's point and negative Murphy's sign. No hernia. Hernia confirmed negative in the left inguinal area.  Neurological: She is alert and oriented to person, place, and time.   Skin: Skin is warm and dry. She is not diaphoretic. No erythema.   Nursing note and vitals reviewed.      Medical Decision Making      Amount and/or Complexity of Data Reviewed  Clinical lab tests: ordered and reviewed        Initial Evaluation:  ED First Provider Contact     Date/Time Event User Comments    08/01/17 1707 ED First Provider Contact Lurlene Ronda E Initial Face to Face Provider Contact          Patient seen by me on 08/01/2017.    Assessment:  21 y.o.female comes to the ED with dysuria      Differential Diagnosis includes: UTI pregnancy ovarian cyst      Plan: Urinalysis urine pregnancy test.  Patient asking to be discharged.  By history she may have a UTI but her urine is reassuring.    Peterson LombardIMOTHY Blakeley Margraf, MD            Peterson LombardLum, Iliza Blankenbeckler, MD  08/01/17 2023

## 2017-08-01 NOTE — ED Triage Notes (Signed)
abd pain thought to be a ovarian cyst per patient. Spotting +, back pain. Took at muscle relaxer at 1 pm pain now 5/10       Triage Note   Marnee SpringKellie M Emilo Gras, RN

## 2017-09-09 ENCOUNTER — Other Ambulatory Visit
Admission: RE | Admit: 2017-09-09 | Discharge: 2017-09-09 | Disposition: A | Discharge status: Home / Self Care | Payer: Medicaid Other | Source: Ambulatory Visit | Attending: Obstetrics | Admitting: Obstetrics

## 2017-09-09 ENCOUNTER — Other Ambulatory Visit: Payer: Self-pay | Admitting: Obstetrics

## 2017-09-09 DIAGNOSIS — Z113 Encounter for screening for infections with a predominantly sexual mode of transmission: Secondary | ICD-10-CM | POA: Insufficient documentation

## 2017-09-09 DIAGNOSIS — Z118 Encounter for screening for other infectious and parasitic diseases: Secondary | ICD-10-CM | POA: Insufficient documentation

## 2017-09-09 DIAGNOSIS — Z112 Encounter for screening for other bacterial diseases: Secondary | ICD-10-CM | POA: Insufficient documentation

## 2017-09-10 LAB — HEPATITIS A,B,C PROF
HBV Core Ab: NEGATIVE
HBV S AG: NEGATIVE
Hep A Total Ab: POSITIVE — AB
Hep B S Ab: 36
Hep C Ab: NEGATIVE

## 2017-09-10 LAB — SYPHILIS SCREEN

## 2017-09-10 LAB — HEPATITIS A ANTIBODY, IGM: Hep A IgM: NEGATIVE

## 2017-09-10 LAB — VAGINITIS SCREEN: DNA PROBE: Vaginitis Screen:DNA Probe: POSITIVE — AB

## 2017-09-11 LAB — CHLAMYDIA PLASMID DNA AMPLIFICATION: Chlamydia Plasmid DNA Amplification: 0

## 2017-09-11 LAB — N. GONORRHOEAE DNA AMPLIFICATION: N. gonorrhoeae DNA Amplification: 0

## 2017-09-12 LAB — GYN CYTOLOGY

## 2017-10-05 ENCOUNTER — Encounter: Payer: Self-pay | Admitting: Pediatrics

## 2017-10-05 DIAGNOSIS — M2240 Chondromalacia patellae, unspecified knee: Secondary | ICD-10-CM | POA: Insufficient documentation

## 2017-10-05 DIAGNOSIS — G43909 Migraine, unspecified, not intractable, without status migrainosus: Secondary | ICD-10-CM

## 2017-11-28 ENCOUNTER — Other Ambulatory Visit: Payer: Self-pay | Admitting: Gastroenterology

## 2017-12-18 ENCOUNTER — Encounter: Payer: Self-pay | Admitting: Gastroenterology

## 2018-01-08 ENCOUNTER — Ambulatory Visit: Payer: Medicaid Other | Attending: Pediatrics | Admitting: Pediatrics

## 2018-01-08 ENCOUNTER — Encounter: Payer: Self-pay | Admitting: Pediatrics

## 2018-01-08 VITALS — BP 98/65 | Ht 62.5 in | Wt 117.0 lb

## 2018-01-08 DIAGNOSIS — Z00129 Encounter for routine child health examination without abnormal findings: Secondary | ICD-10-CM

## 2018-01-08 DIAGNOSIS — Z0000 Encounter for general adult medical examination without abnormal findings: Secondary | ICD-10-CM

## 2018-01-08 MED ORDER — DOCUSATE SODIUM 100 MG PO CAPS *I*
100.0000 mg | ORAL_CAPSULE | Freq: Two times a day (BID) | ORAL | 3 refills | Status: AC | PRN
Start: 2018-01-08 — End: ?
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
## 2018-02-09 ENCOUNTER — Ambulatory Visit: Payer: Medicaid Other

## 2018-02-09 ENCOUNTER — Encounter: Payer: Self-pay | Admitting: Gastroenterology

## 2018-02-26 ENCOUNTER — Other Ambulatory Visit: Payer: Self-pay

## 2018-02-26 LAB — HCT AND HGB
Hematocrit: 41 % (ref 35–47)
Hemoglobin: 13.6 g/dL (ref 12.0–16.0)

## 2018-02-26 LAB — CREATININE, SERUM: Creatinine: 0.8 mg/dL (ref 0.5–0.9)

## 2018-02-26 LAB — ESTIMATED GFR
GFR,Black: >60 mL/min
GFR,Caucasian: >60 mL/min

## 2018-02-26 LAB — POTASSIUM: Potassium: 4.2 mEq/L (ref 3.5–5.1)

## 2018-02-27 ENCOUNTER — Encounter: Payer: Self-pay | Admitting: Gastroenterology

## 2018-08-07 ENCOUNTER — Other Ambulatory Visit
Admission: RE | Admit: 2018-08-07 | Discharge: 2018-08-07 | Disposition: A | Discharge status: Home / Self Care | Payer: Medicaid Other | Source: Ambulatory Visit | Attending: Obstetrics and Gynecology | Admitting: Obstetrics and Gynecology

## 2018-08-07 DIAGNOSIS — Z112 Encounter for screening for other bacterial diseases: Secondary | ICD-10-CM | POA: Insufficient documentation

## 2018-08-07 DIAGNOSIS — R350 Frequency of micturition: Secondary | ICD-10-CM | POA: Insufficient documentation

## 2018-08-07 LAB — VAGINITIS SCREEN: DNA PROBE: Vaginitis Screen:DNA Probe: POSITIVE — AB

## 2018-09-23 LAB — AEROBIC CULTURE: Aerobic Culture: 0

## 2019-01-07 ENCOUNTER — Encounter: Payer: Self-pay | Admitting: Pediatrics

## 2019-01-11 ENCOUNTER — Encounter (HOSPITAL_COMMUNITY): Payer: Self-pay | Admitting: Emergency Medicine

## 2019-01-11 ENCOUNTER — Ambulatory Visit (HOSPITAL_COMMUNITY)
Admission: EM | Admit: 2019-01-11 | Discharge: 2019-01-11 | Disposition: A | Discharge status: Home / Self Care | Payer: Medicaid Other | Attending: Family Medicine | Admitting: Family Medicine

## 2019-01-11 DIAGNOSIS — M25561 Pain in right knee: Secondary | ICD-10-CM | POA: Diagnosis not present

## 2019-01-11 HISTORY — DX: Unspecified ovarian cyst, unspecified side: N83.209

## 2019-01-11 NOTE — Discharge Instructions (Signed)
Please take ibuprofen or tylenol for pain  Please put ice on the knee.  Please continue the brace.
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
## 2019-01-11 NOTE — ED Triage Notes (Signed)
Pt sts right knee pain x several weeks; pt had knee brace and told may need ortho follow up

## 2019-07-04 ENCOUNTER — Emergency Department (HOSPITAL_COMMUNITY)
Admission: EM | Admit: 2019-07-04 | Discharge: 2019-07-04 | Disposition: A | Discharge status: Home / Self Care | Payer: Medicaid Other | Attending: Emergency Medicine | Admitting: Emergency Medicine

## 2019-07-04 ENCOUNTER — Emergency Department (HOSPITAL_COMMUNITY): Payer: Medicaid Other

## 2019-07-04 ENCOUNTER — Encounter (HOSPITAL_COMMUNITY): Payer: Self-pay | Admitting: Emergency Medicine

## 2019-07-04 ENCOUNTER — Other Ambulatory Visit: Payer: Self-pay

## 2019-07-04 DIAGNOSIS — Y9389 Activity, other specified: Secondary | ICD-10-CM | POA: Insufficient documentation

## 2019-07-04 DIAGNOSIS — R51 Headache: Secondary | ICD-10-CM | POA: Insufficient documentation

## 2019-07-04 DIAGNOSIS — Y999 Unspecified external cause status: Secondary | ICD-10-CM | POA: Insufficient documentation

## 2019-07-04 DIAGNOSIS — Y92410 Unspecified street and highway as the place of occurrence of the external cause: Secondary | ICD-10-CM | POA: Insufficient documentation

## 2019-07-04 DIAGNOSIS — S29019A Strain of muscle and tendon of unspecified wall of thorax, initial encounter: Secondary | ICD-10-CM

## 2019-07-04 DIAGNOSIS — S199XXA Unspecified injury of neck, initial encounter: Secondary | ICD-10-CM | POA: Diagnosis present

## 2019-07-04 DIAGNOSIS — S161XXA Strain of muscle, fascia and tendon at neck level, initial encounter: Secondary | ICD-10-CM | POA: Insufficient documentation

## 2019-07-04 MED ORDER — CYCLOBENZAPRINE HCL 10 MG PO TABS
10.0000 mg | ORAL_TABLET | Freq: Once | ORAL | Status: AC
  Administered 2019-07-04: 10 mg via ORAL
  Filled 2019-07-04: qty 1

## 2019-07-04 MED ORDER — CYCLOBENZAPRINE HCL 10 MG PO TABS: 10.0000 mg | ORAL_TABLET | Freq: Two times a day (BID) | ORAL | 0 refills | Status: AC | PRN

## 2019-07-04 MED ORDER — IBUPROFEN 400 MG PO TABS
400.0000 mg | ORAL_TABLET | Freq: Once | ORAL | Status: AC
  Administered 2019-07-04: 400 mg via ORAL
  Filled 2019-07-04: qty 1

## 2019-07-04 NOTE — ED Notes (Signed)
Patient verbalizes understanding of discharge instructions. Opportunity for questioning and answers were provided. Armband removed by staff, pt discharged from ED.  
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
## 2019-07-04 NOTE — ED Notes (Signed)
Pt refused blood draw,  Stated she will use the bathroom.
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
## 2019-07-05 ENCOUNTER — Emergency Department (HOSPITAL_COMMUNITY)
Admission: EM | Admit: 2019-07-05 | Discharge: 2019-07-05 | Disposition: A | Discharge status: Home / Self Care | Payer: Medicaid Other | Attending: Emergency Medicine | Admitting: Emergency Medicine

## 2019-07-05 ENCOUNTER — Other Ambulatory Visit: Payer: Self-pay

## 2019-07-05 DIAGNOSIS — M7918 Myalgia, other site: Secondary | ICD-10-CM

## 2019-07-05 DIAGNOSIS — R51 Headache: Secondary | ICD-10-CM | POA: Diagnosis present

## 2019-07-05 DIAGNOSIS — M791 Myalgia, unspecified site: Secondary | ICD-10-CM | POA: Insufficient documentation

## 2019-07-05 MED ORDER — IBUPROFEN 400 MG PO TABS: 600.0000 mg | ORAL_TABLET | Freq: Once | ORAL | Status: DC
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
## 2019-07-05 NOTE — Discharge Instructions (Addendum)
Please read attached information. If you experience any new or worsening signs or symptoms please return to the emergency room for evaluation. Please follow-up with your primary care provider or specialist as discussed. Please use medication prescribed only as directed and discontinue taking if you have any concerning signs or symptoms.   °
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
## 2023-05-21 ENCOUNTER — Other Ambulatory Visit
Admission: RE | Admit: 2023-05-21 | Discharge: 2023-05-21 | Disposition: A | Discharge status: Home / Self Care | Payer: Medicaid Other | Source: Ambulatory Visit | Attending: Physician Assistant | Admitting: Physician Assistant

## 2023-05-21 DIAGNOSIS — Z124 Encounter for screening for malignant neoplasm of cervix: Secondary | ICD-10-CM | POA: Insufficient documentation

## 2023-06-06 LAB — GYN CYTOLOGY

## 7851-09-30 DEATH — deceased
